# Patient Record
Sex: Female | Born: 1965 | Race: Black or African American | Hispanic: No | Marital: Married | Smoking: Never smoker
Health system: Southern US, Community
[De-identification: ages and names within clinical notes are randomized; demographics above are authoritative.]

## PROBLEM LIST (undated history)

## (undated) DIAGNOSIS — E669 Obesity, unspecified: Secondary | ICD-10-CM

## (undated) DIAGNOSIS — I10 Essential (primary) hypertension: Secondary | ICD-10-CM

---

## 2009-10-20 ENCOUNTER — Emergency Department (HOSPITAL_COMMUNITY): Admission: EM | Admit: 2009-10-20 | Discharge: 2009-10-20 | Payer: Self-pay | Admitting: Emergency Medicine

## 2011-03-22 LAB — POCT I-STAT, CHEM 8
Chloride: 104 mEq/L (ref 96–112)
Creatinine, Ser: 0.8 mg/dL (ref 0.4–1.2)
Glucose, Bld: 99 mg/dL (ref 70–99)
Potassium: 4.1 mEq/L (ref 3.5–5.1)

## 2016-06-19 ENCOUNTER — Emergency Department (HOSPITAL_COMMUNITY)
Admission: EM | Admit: 2016-06-19 | Discharge: 2016-06-19 | Disposition: A | Discharge status: Home / Self Care | Payer: Self-pay | Attending: Emergency Medicine | Admitting: Emergency Medicine

## 2016-06-19 ENCOUNTER — Encounter (HOSPITAL_COMMUNITY): Payer: Self-pay | Admitting: Family Medicine

## 2016-06-19 ENCOUNTER — Emergency Department (HOSPITAL_COMMUNITY): Payer: Self-pay

## 2016-06-19 DIAGNOSIS — R059 Cough, unspecified: Secondary | ICD-10-CM

## 2016-06-19 DIAGNOSIS — I10 Essential (primary) hypertension: Secondary | ICD-10-CM | POA: Insufficient documentation

## 2016-06-19 DIAGNOSIS — J029 Acute pharyngitis, unspecified: Secondary | ICD-10-CM | POA: Insufficient documentation

## 2016-06-19 DIAGNOSIS — R05 Cough: Secondary | ICD-10-CM

## 2016-06-19 HISTORY — DX: Essential (primary) hypertension: I10

## 2016-06-19 MED ORDER — HYDROCODONE-ACETAMINOPHEN 5-325 MG PO TABS
1.0000 | ORAL_TABLET | Freq: Once | ORAL | Status: AC
  Administered 2016-06-19: 1 via ORAL
  Filled 2016-06-19: qty 1

## 2016-06-19 MED ORDER — METOCLOPRAMIDE HCL 10 MG PO TABS
10.0000 mg | ORAL_TABLET | Freq: Once | ORAL | Status: AC
  Administered 2016-06-19: 10 mg via ORAL
  Filled 2016-06-19: qty 1

## 2016-06-19 MED ORDER — SODIUM CHLORIDE 0.9 % IV BOLUS (SEPSIS)
500.0000 mL | Freq: Once | INTRAVENOUS | Status: AC
  Administered 2016-06-19: 500 mL via INTRAVENOUS

## 2016-06-19 MED ORDER — ONDANSETRON HCL 4 MG/2ML IJ SOLN
4.0000 mg | Freq: Once | INTRAMUSCULAR | Status: AC
  Administered 2016-06-19: 4 mg via INTRAVENOUS
  Filled 2016-06-19: qty 2

## 2016-06-19 NOTE — ED Provider Notes (Signed)
CSN: [id number]     Arrival date & time 06/19/16  1657 History   First MD Initiated Contact with Patient 06/19/16 2038     Chief Complaint  Patient presents with  . Facial Swelling     (Consider location/radiation/quality/duration/timing/severity/associated sxs/prior Treatment) Patient is a 50 y.o. female presenting with cough. The history is provided by the patient.  Cough Cough characteristics:  Productive Sputum characteristics:  Yellow and green Severity:  Moderate Onset quality:  Gradual Duration:  1 week Timing:  Constant Progression:  Unchanged Chronicity:  New Smoker: no   Context: sick contacts (son has been sick)   Relieved by:  None tried Associated symptoms: headaches, rhinorrhea, sinus congestion, sore throat and wheezing   Associated symptoms: no chest pain, no chills, no fever, no myalgias, no rash and no shortness of breath     Past Medical History  Diagnosis Date  . Hypertension    History reviewed. No pertinent past surgical history. History reviewed. No pertinent family history. Social History  Substance Use Topics  . Smoking status: Never Smoker   . Smokeless tobacco: None  . Alcohol Use: No   OB History    No data available     Review of Systems  Constitutional: Negative for fever and chills.  HENT: Positive for congestion, rhinorrhea and sore throat.   Eyes: Positive for photophobia. Negative for visual disturbance.  Respiratory: Positive for cough and wheezing. Negative for shortness of breath.   Cardiovascular: Negative for chest pain.  Gastrointestinal: Positive for nausea. Negative for vomiting, diarrhea, constipation and abdominal distention.  Genitourinary: Negative for dysuria.  Musculoskeletal: Negative for myalgias.  Skin: Negative for rash and wound.  Neurological: Positive for headaches.  Psychiatric/Behavioral: Negative for confusion and agitation.      Allergies  Review of patient's allergies indicates no known  allergies.  Home Medications   Prior to Admission medications   Medication Sig Start Date End Date Taking? Authorizing Provider  ibuprofen (ADVIL,MOTRIN) 200 MG tablet Take 400 mg by mouth every 6 (six) hours as needed (pain).   Yes Historical Provider, MD  OVER THE COUNTER MEDICATION Take 1 tablet by mouth 2 (two) times daily as needed (pain/swelling). Multi-symptom allergy med   Yes Historical Provider, MD   BP 143/72 mmHg  Pulse 60  Temp(Src) 98.8 F (37.1 C) (Oral)  Resp 18  SpO2 100% Physical Exam  Constitutional: She is oriented to person, place, and time. She appears well-developed and well-nourished. No distress.  HENT:  Head: Normocephalic and atraumatic.  Tenderness to right temple. Tender left anterior cervical lymphadenopathy. Erythema of the posterior oropharynx with no swelling or pus.   Eyes: Conjunctivae are normal.  Cardiovascular: Normal rate and normal heart sounds.   No murmur heard. Pulmonary/Chest: Effort normal. No respiratory distress. She has wheezes. She has rales.  Abdominal: Soft. There is no tenderness.  Musculoskeletal: She exhibits no edema.  Neurological: She is alert and oriented to person, place, and time.  Skin: Skin is warm. She is not diaphoretic.  Psychiatric: She has a normal mood and affect. Her behavior is normal.  Nursing note and vitals reviewed.   ED Course  Procedures (including critical care time) Labs Review Labs Reviewed - No data to display  Imaging Review Dg Chest 2 View  06/19/2016  CLINICAL DATA:  Cough, congestion and headache, several days duration. Worsened dizziness. History of hypertension. EXAM: CHEST  2 VIEW COMPARISON:  None. FINDINGS: Heart size is normal. Mediastinal shadows are normal except for some unfolding  of the aorta. Pulmonary vascularity is normal. Lungs are clear. No effusions. No bony abnormalities. 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 No active disease. Electronically  Signed   By: Paulina FusiMark  Shogry M.D.   On: 06/19/2016 21:34   I have personally reviewed and evaluated these images and lab results as part of my medical decision-making.   EKG Interpretation None      MDM   Final diagnoses:  Cough  Sore throat    Patient presents for 2-3 days of cough, sore throat, and left cervical lymphadenopathy.  No fever.  Associated right sided headache. Has mild tenderness of temple. Unlikely temporal arteritis given well appearance, no fever, concomitant viral symptoms. Headache improved with reglan in ED. CXR shows no acute abnormalities.   Patient given strict return precuations and discharged home in good condition. Seen with Dr. Lynelle DoctorKnapp.   Levora AngelEric Julliette Frentz, MD 06/20/16 1333  Linwood DibblesJon Knapp, MD 06/21/16 [phone]

## 2016-06-19 NOTE — ED Notes (Signed)
Pt here for left sided facial swelling and swelling  in left jaw and neck since yesterday. sts headache.

## 2016-06-19 NOTE — Discharge Instructions (Signed)

## 2016-06-19 NOTE — ED Notes (Signed)
Patient Alert and oriented X4. Stable and ambulatory. Patient verbalized understanding of the discharge instructions.  Patient belongings were taken by the patient.  

## 2017-02-06 ENCOUNTER — Emergency Department (HOSPITAL_COMMUNITY)
Admission: EM | Admit: 2017-02-06 | Discharge: 2017-02-06 | Disposition: A | Discharge status: Home / Self Care | Payer: BLUE CROSS/BLUE SHIELD | Attending: Emergency Medicine | Admitting: Emergency Medicine

## 2017-02-06 ENCOUNTER — Encounter (HOSPITAL_COMMUNITY): Payer: Self-pay | Admitting: *Deleted

## 2017-02-06 ENCOUNTER — Emergency Department (HOSPITAL_COMMUNITY): Payer: BLUE CROSS/BLUE SHIELD

## 2017-02-06 DIAGNOSIS — J0111 Acute recurrent frontal sinusitis: Secondary | ICD-10-CM | POA: Insufficient documentation

## 2017-02-06 DIAGNOSIS — J3489 Other specified disorders of nose and nasal sinuses: Secondary | ICD-10-CM | POA: Diagnosis present

## 2017-02-06 DIAGNOSIS — I10 Essential (primary) hypertension: Secondary | ICD-10-CM | POA: Diagnosis not present

## 2017-02-06 DIAGNOSIS — R0789 Other chest pain: Secondary | ICD-10-CM

## 2017-02-06 DIAGNOSIS — J0110 Acute frontal sinusitis, unspecified: Secondary | ICD-10-CM

## 2017-02-06 HISTORY — DX: Obesity, unspecified: E66.9

## 2017-02-06 LAB — CBC
HCT: 41 % (ref 36.0–46.0)
Hemoglobin: 13.4 g/dL (ref 12.0–15.0)
MCH: 28.2 pg (ref 26.0–34.0)
MCHC: 32.7 g/dL (ref 30.0–36.0)
MCV: 86.1 fL (ref 78.0–100.0)
PLATELETS: 272 10*3/uL (ref 150–400)
RBC: 4.76 MIL/uL (ref 3.87–5.11)
RDW: 14.6 % (ref 11.5–15.5)
WBC: 7.4 10*3/uL (ref 4.0–10.5)

## 2017-02-06 LAB — I-STAT TROPONIN, ED: TROPONIN I, POC: 0 ng/mL (ref 0.00–0.08)

## 2017-02-06 LAB — BASIC METABOLIC PANEL
Anion gap: 8 (ref 5–15)
BUN: 8 mg/dL (ref 6–20)
CALCIUM: 9.7 mg/dL (ref 8.9–10.3)
CHLORIDE: 102 mmol/L (ref 101–111)
CO2: 27 mmol/L (ref 22–32)
CREATININE: 0.96 mg/dL (ref 0.44–1.00)
GFR calc non Af Amer: >60 mL/min (ref >60)
Glucose, Bld: 93 mg/dL (ref 65–99)
Potassium: 3.8 mmol/L (ref 3.5–5.1)
SODIUM: 137 mmol/L (ref 135–145)

## 2017-02-06 MED ORDER — BENZONATATE 100 MG PO CAPS: 100.0000 mg | ORAL_CAPSULE | Freq: Three times a day (TID) | ORAL | 0 refills | Status: DC

## 2017-02-06 MED ORDER — CHLORTHALIDONE 25 MG PO TABS: 25.0000 mg | ORAL_TABLET | Freq: Every day | ORAL | 0 refills | Status: DC

## 2017-02-06 NOTE — ED Provider Notes (Signed)
[facility]-EMERGENCY DEPT Provider Note   CSN: [id number] Arrival date & time: 02/06/17  1411     History   Chief Complaint Chief Complaint  Patient presents with  . Chest Pain  . Shortness of Breath    HPI Deanna Munoz is a 51 y.o. female.  HPI   51 year old female presents today with numerous complaints.  She reports 2 weeks of upper respiratory congestion, sinus pressure, and rhinorrhea.  She notes some minor shortness of breath with exertion secondary to upper respiratory congestion.  She notes persistent cough that has now caused anterior chest wall pain worse with coughing or palpation.  Patient notes she was seen in her primary care provider's office for the above symptoms, was noted to be hypertensive.  Patient has a history of the same, but has not been on any medication recently.  She notes that the primary care provider felt uncomfortable managing her blood pressure was in the 180 systolic region and wanted her to be evaluated in the emergency room.  Patient notes that she has a history of migraines.  She notes today's headache feels similar with associated paresthesias in her hands.  She denies any history of asthma or allergies, no shortness of breath or chest pain at rest.     Past Medical History:  Diagnosis Date  . Hypertension   . Obesity     There are no active problems to display for this patient.   History reviewed. No pertinent surgical history.  OB History    No data available      Home Medications    Prior to Admission medications   Medication Sig Start Date End Date Taking? Authorizing Provider  benzonatate (TESSALON) 100 MG capsule Take 1 capsule (100 mg total) by mouth every 8 (eight) hours. 02/06/17   Eyvonne MechanicJeffrey Dayna Alia, PA-C  chlorthalidone (HYGROTON) 25 MG tablet Take 1 tablet (25 mg total) by mouth daily. 02/06/17   Eyvonne MechanicJeffrey Leeon Makar, PA-C  ibuprofen (ADVIL,MOTRIN) 200 MG tablet Take 400 mg by mouth every 6 (six) hours as needed (pain).    Historical  Provider, MD  OVER THE COUNTER MEDICATION Take 1 tablet by mouth 2 (two) times daily as needed (pain/swelling). Multi-symptom allergy med    Historical Provider, MD    Family History History reviewed. No pertinent family history.  Social History Social History  Substance Use Topics  . Smoking status: Never Smoker  . Smokeless tobacco: Not on file  . Alcohol use No     Allergies   Patient has no known allergies.   Review of Systems Review of Systems  All other systems reviewed and are negative.    Physical Exam Updated Vital Signs BP 175/92   Pulse (!) 57   Temp 98.6 F (37 C) (Oral)   Resp 18   Ht 5' 4.5" (1.638 m)   Wt 113.5 kg   SpO2 98%   BMI 42.28 kg/m   Physical Exam  Constitutional: She is oriented to person, place, and time. She appears well-developed and well-nourished.  HENT:  Head: Normocephalic and atraumatic.  Nose: Rhinorrhea present.  Eyes: Conjunctivae are normal. Pupils are equal, round, and reactive to light. Right eye exhibits no discharge. Left eye exhibits no discharge. No scleral icterus.  Neck: Normal range of motion. No JVD present. No tracheal deviation present.  Cardiovascular: Normal rate and regular rhythm.   Pulmonary/Chest: Effort normal and breath sounds normal. No stridor. No respiratory distress. She has no wheezes. She has no rales. She exhibits no tenderness.  Anterior chest wall over the sternum exquisitely tender to palpation; this is patient's reported pain  Abdominal: Soft. There is no tenderness.  Musculoskeletal: She exhibits no edema.  Neurological: She is alert and oriented to person, place, and time. No cranial nerve deficit or sensory deficit. Coordination normal. GCS eye subscore is 4. GCS verbal subscore is 5. GCS motor subscore is 6.  Skin: Skin is warm.  Psychiatric: She has a normal mood and affect. Her behavior is normal. Judgment and thought content normal.  Nursing note and vitals reviewed.   ED Treatments /  Results  Labs (all labs ordered are listed, but only abnormal results are displayed) Labs Reviewed  BASIC METABOLIC PANEL  CBC  I-STAT TROPOININ, ED    EKG  EKG Interpretation None       Radiology Dg Chest 2 View  Result Date: 02/06/2017 CLINICAL DATA:  Cp. Pt has been having left side chest pain since this morning. She went to primary care and was told to come to the hospital. She said her blood pressure was reading 180 over 110 at primary care. She also stated that she's been feeling numbness down her entire left arm, with a headache for 2 days. She stated that she had a cold for 2 weeks before this. Hx of HTN EXAM: CHEST  2 VIEW COMPARISON:  06/19/2016. FINDINGS: The heart size and mediastinal contours are within normal limits. Both lungs are clear. No pleural effusion or pneumothorax. The visualized skeletal structures are unremarkable. IMPRESSION: No active cardiopulmonary disease. Electronically Signed   By: Amie Portland M.D.   On: 02/06/2017 14:53    Procedures Procedures (including critical care time)  Medications Ordered in ED Medications - No data to display   Initial Impression / Assessment and Plan / ED Course  I have reviewed the triage vital signs and the nursing notes.  Pertinent labs & imaging results that were available during my care of the patient were reviewed by me and considered in my medical decision making (see chart for details).      Final Clinical Impressions(s) / ED Diagnoses   Final diagnoses:  Acute non-recurrent frontal sinusitis  Chest wall pain  Hypertension, unspecified type    Labs: Stat troponin, BMP, CBC  Imaging: CT chest 2 view  Consults:  Therapeutics:  Discharge Meds: Chlorthalidone, Tessalon  Assessment/Plan:   Patient's presentation is most consistent with sinusitis.  Based on her continued complaints over the last several weeks she will be started on Augmentin for presumed bacterial sinusitis.  Patient's chest pain  is reproducible on exam, she has no signs of ACS, PE, this is likely secondary to recent cough and upper respiratory infection.  Patient hypertensive here, she has a history of the same not currently taking any medications.  She reports that her primary care wants to see her in the next 2 days after ED evaluation.  She will be started on chlorthalidone with close follow-up with primary care.  She has no signs of endorgan damage here today.  Patient is given strict return precautions, she verbalized understanding and agreement to today's plan had no further questions or concerns at the time of discharge.     New Prescriptions Discharge Medication List as of 02/06/2017  6:28 PM    START taking these medications   Details  chlorthalidone (HYGROTON) 25 MG tablet Take 1 tablet (25 mg total) by mouth daily., Starting Tue 02/06/2017, Print         Eyvonne Mechanic, PA-C 02/06/17 2020  Benjiman Core, MD 02/08/17 [phone]

## 2017-02-06 NOTE — ED Triage Notes (Signed)
Pt reports recent chest pains, left arm pain/numbness x 2 days. Also having headache, bilateral leg swelling, cough, sob with exertion. ekg done at triage.

## 2017-02-06 NOTE — Discharge Instructions (Signed)
Please read attached information. If you experience any new or worsening signs or symptoms please return to the emergency room for evaluation. Please follow-up with your primary care provider tomorrow for re-evaluation. Please use medication prescribed only as directed and discontinue taking if you have any concerning signs or symptoms.

## 2017-02-06 NOTE — ED Notes (Signed)
Pt comfortable with discharge and follow up instructions. Pt declines wheelchair, escorted to waiting area by this RN. Rx x2 

## 2017-02-28 ENCOUNTER — Other Ambulatory Visit: Payer: Self-pay | Admitting: Internal Medicine

## 2017-02-28 DIAGNOSIS — Z1231 Encounter for screening mammogram for malignant neoplasm of breast: Secondary | ICD-10-CM

## 2017-03-01 ENCOUNTER — Ambulatory Visit: Payer: 59

## 2017-03-02 ENCOUNTER — Ambulatory Visit
Admission: RE | Admit: 2017-03-02 | Discharge: 2017-03-02 | Disposition: A | Discharge status: Home / Self Care | Payer: BLUE CROSS/BLUE SHIELD | Source: Ambulatory Visit | Attending: Internal Medicine | Admitting: Internal Medicine

## 2017-03-02 DIAGNOSIS — Z1231 Encounter for screening mammogram for malignant neoplasm of breast: Secondary | ICD-10-CM

## 2018-02-08 ENCOUNTER — Other Ambulatory Visit: Payer: Self-pay | Admitting: Nurse Practitioner

## 2018-02-08 DIAGNOSIS — Z1231 Encounter for screening mammogram for malignant neoplasm of breast: Secondary | ICD-10-CM

## 2018-02-15 ENCOUNTER — Other Ambulatory Visit: Payer: Self-pay

## 2018-02-15 ENCOUNTER — Encounter: Payer: Self-pay | Admitting: Emergency Medicine

## 2018-02-15 ENCOUNTER — Emergency Department
Admission: EM | Admit: 2018-02-15 | Discharge: 2018-02-15 | Disposition: A | Discharge status: Home / Self Care | Payer: Worker's Compensation | Attending: Emergency Medicine | Admitting: Emergency Medicine

## 2018-02-15 DIAGNOSIS — I10 Essential (primary) hypertension: Secondary | ICD-10-CM | POA: Diagnosis not present

## 2018-02-15 DIAGNOSIS — M5442 Lumbago with sciatica, left side: Secondary | ICD-10-CM | POA: Diagnosis not present

## 2018-02-15 DIAGNOSIS — Z79899 Other long term (current) drug therapy: Secondary | ICD-10-CM | POA: Insufficient documentation

## 2018-02-15 DIAGNOSIS — M545 Low back pain: Secondary | ICD-10-CM | POA: Diagnosis present

## 2018-02-15 MED ORDER — LIDOCAINE 5 % EX PTCH
2.0000 | MEDICATED_PATCH | CUTANEOUS | Status: DC
  Administered 2018-02-15: 2 via TRANSDERMAL
  Filled 2018-02-15: qty 2

## 2018-02-15 MED ORDER — CYCLOBENZAPRINE HCL 10 MG PO TABS: 10.0000 mg | ORAL_TABLET | Freq: Three times a day (TID) | ORAL | 0 refills | Status: DC | PRN

## 2018-02-15 MED ORDER — KETOROLAC TROMETHAMINE 60 MG/2ML IM SOLN
60.0000 mg | Freq: Once | INTRAMUSCULAR | Status: AC
  Administered 2018-02-15: 60 mg via INTRAMUSCULAR
  Filled 2018-02-15: qty 2

## 2018-02-15 NOTE — ED Triage Notes (Addendum)
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

## 2018-02-15 NOTE — ED Notes (Signed)
workmans comp UDS completed.  Sample walked to lab.

## 2018-02-15 NOTE — ED Provider Notes (Signed)
[facility] Emergency Department Provider Note    First MD Initiated Contact with Patient 02/15/18 [phone]     (approximate)  I have reviewed the triage vital signs and the nursing notes.   HISTORY  Chief Complaint Back Pain    HPI Deanna Munoz is a 52 y.o. female presents to the emergency department with left-sided low back pain with radiation down.  Patient states current pain score is 8 out of 10 and worse with any movement or walking.  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  Patient denies any bowel or bladder incontinence.  Patient denies any fever   Past Medical History:  Diagnosis Date  . Hypertension   . Obesity     There are no active problems to display for this patient.   History reviewed. No pertinent surgical history.  Prior to Admission medications   Medication Sig Start Date End Date Taking? Authorizing Provider  benzonatate (TESSALON) 100 MG capsule Take 1 capsule (100 mg total) by mouth every 8 (eight) hours. 02/06/17   Hedges, Tinnie Gens, PA-C  chlorthalidone (HYGROTON) 25 MG tablet Take 1 tablet (25 mg total) by mouth daily. 02/06/17   Hedges, Tinnie Gens, PA-C  ibuprofen (ADVIL,MOTRIN) 200 MG tablet Take 400 mg by mouth every 6 (six) hours as needed (pain).    [provider]  OVER THE COUNTER MEDICATION Take 1 tablet by mouth 2 (two) times daily as needed (pain/swelling). Multi-symptom allergy med    [provider]    Allergies No known drug allergies No family history on file.  Social History Social History   Tobacco Use  . Smoking status: Never Smoker  . Smokeless tobacco: Never Used  Substance Use Topics  . Alcohol use: No  . Drug use: No    Review of Systems Constitutional: No fever/chills Eyes: No visual changes. ENT: No sore throat. Cardiovascular: Denies chest  pain. Respiratory: Denies shortness of breath. Gastrointestinal: No abdominal pain.  No nausea, no vomiting.  No diarrhea.  No constipation. Genitourinary: Negative for dysuria. Musculoskeletal: Negative for neck pain.  Positive for back pain. Integumentary: Negative for rash. Neurological: Negative for headaches, focal weakness or numbness.   ____________________________________________   PHYSICAL EXAM:  VITAL SIGNS: ED Triage Vitals [02/15/18 0023]  Enc Vitals Group     BP 123/83     Pulse Rate 70     Resp 18     Temp 98.3 F (36.8 C)     Temp Source Oral     SpO2 100 %     Weight 117.9 kg (260 lb)     Height 1.651 m (5\' 5" )     Head Circumference      Peak Flow      Pain Score 7     Pain Loc      Pain Edu?      Excl. in GC?     Constitutional: Alert and oriented. Well appearing and in no acute distress. Eyes: Conjunctivae are normal.  Head: Atraumatic. Mouth/Throat: Mucous membranes are moist.  Oropharynx non-erythematous. Neck: No stridor.   Cardiovascular: Normal rate, regular rhythm. Good peripheral circulation. Grossly normal heart sounds. Respiratory: Normal respiratory effort.  No retractions. Lungs CTAB. Gastrointestinal: Soft and nontender. No distention.  Musculoskeletal: Pain to palpation left lumbar paraspinal muscles Neurologic:  Normal speech and language. No gross focal neurologic deficits are appreciated.  Skin:  Skin is warm, dry and intact. No rash noted. Psychiatric: Mood and affect are normal. Speech and behavior are normal.    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  Concern for  possible sciatica given radiation down the posterior left versus lumbar spinal muscle strain.  Patient given IM Toradol and Lidoderm patch applied to the area.  Spoke with the patient at length regarding the necessity of following up with Dr. Marcell BarlowYarborough given possibility of sciatica ____________________________________________  FINAL CLINICAL IMPRESSION(S) / ED DIAGNOSES  Final diagnoses:  Acute left-sided low back pain with left-sided sciatica     MEDICATIONS GIVEN DURING THIS VISIT:  Medications  ketorolac (TORADOL) injection 60 mg (not administered)  lidocaine (LIDODERM) 5 % 2 patch (not administered)     ED Discharge Orders    None       Note:  This document was prepared using Dragon voice recognition software and may include unintentional dictation errors.    Darci CurrentBrown, [place] N, MD 02/15/18 [phone]

## 2018-02-15 NOTE — ED Notes (Signed)
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

## 2018-02-15 NOTE — ED Notes (Signed)
ED Provider at bedside. 

## 2018-03-04 ENCOUNTER — Ambulatory Visit: Payer: BLUE CROSS/BLUE SHIELD

## 2018-03-15 ENCOUNTER — Ambulatory Visit
Admission: RE | Admit: 2018-03-15 | Discharge: 2018-03-15 | Disposition: A | Discharge status: Home / Self Care | Payer: Commercial Managed Care - PPO | Source: Ambulatory Visit | Attending: Nurse Practitioner | Admitting: Nurse Practitioner

## 2018-03-15 DIAGNOSIS — Z1231 Encounter for screening mammogram for malignant neoplasm of breast: Secondary | ICD-10-CM

## 2020-09-15 NOTE — Patient Instructions (Signed)
Thank you for choosing Primary Care at [facility] to be your medical home!    Tekeisha Gisler was seen by De Hollingshead, DO today.   Malvika Hennick's primary care provider is Marcy Siren, DO.   For the best care possible, you should try to see Marcy Siren, DO whenever you come to the clinic.   We look forward to seeing you again soon!  If you have any questions about your visit today, please call us at [phone] or feel free to reach your primary care provider via MyChart.

## 2020-09-16 ENCOUNTER — Encounter: Payer: Self-pay | Admitting: Internal Medicine

## 2020-09-16 ENCOUNTER — Telehealth (INDEPENDENT_AMBULATORY_CARE_PROVIDER_SITE_OTHER): Payer: 59 | Admitting: Internal Medicine

## 2020-09-16 DIAGNOSIS — Z7689 Persons encountering health services in other specified circumstances: Secondary | ICD-10-CM

## 2020-09-16 DIAGNOSIS — I10 Essential (primary) hypertension: Secondary | ICD-10-CM | POA: Diagnosis not present

## 2020-09-16 DIAGNOSIS — Z1211 Encounter for screening for malignant neoplasm of colon: Secondary | ICD-10-CM

## 2020-09-16 DIAGNOSIS — Z1231 Encounter for screening mammogram for malignant neoplasm of breast: Secondary | ICD-10-CM

## 2020-09-16 NOTE — Progress Notes (Signed)
Virtual Visit via Telephone Note  I connected with Deanna Munoz, on 09/16/2020 at 3:44 PM by telephone due to the COVID-19 pandemic and verified that I am speaking with the correct person using two identifiers.   Consent: I discussed the limitations, risks, security and privacy concerns of performing an evaluation and management service by telephone and the availability of in person appointments. I also discussed with the patient that there may be a patient responsible charge related to this service. The patient expressed understanding and agreed to proceed.   Location of Patient: Home   Location of Provider: Clinic    Persons participating in Telemedicine visit: Sanai Pearson [facility] Dr. Earlene Plater      History of Present Illness: Patient has a visit to establish care. Used to have care at [facility]. Was due to have her annual check up in March 2021 but this did not happen. PMH of seasonal allergies with mild asthma on Singulair and HTN on Chlorthalidone 50 mg. Last time she checked her BP at home was about 2 weeks ago. Last BP was 140-150/80s.   She has had a hysterectomy--she is unsure if she had her cervix removed.    Past Medical History:  Diagnosis Date  . Hypertension   . Obesity    No Known Allergies  Current Outpatient Medications on File Prior to Visit  Medication Sig Dispense Refill  . chlorthalidone (HYGROTON) 50 MG tablet Take 50 mg by mouth daily.    . montelukast (SINGULAIR) 10 MG tablet Take 10 mg by mouth daily as needed (allergies).     No current facility-administered medications on file prior to visit.    Observations/Objective: NAD. Speaking clearly.  Work of breathing normal.  Alert and oriented. Mood appropriate.   Assessment and Plan: 1. Encounter to establish care Reviewed patient's PMH, social history, surgical history, and medications.  Is overdue for annual exam, screening blood work, and health maintenance topics.  Have asked patient to return for visit to address these items.  Will need to determine if she has cervix and if she needs PAP.   2. Essential hypertension Unclear what BP trend is. Will need to monitor in person at future visit. Asymptomatic.   3. Breast cancer screening by mammogram - MM Digital Screening; Future  4. Colon cancer screening - Ambulatory referral to Gastroenterology   Follow Up Instructions: Annual exam    I discussed the assessment and treatment plan with the patient. The patient was provided an opportunity to ask questions and all were answered. The patient agreed with the plan and demonstrated an understanding of the instructions.   The patient was advised to call back or seek an in-person evaluation if the symptoms worsen or if the condition fails to improve as anticipated.     I provided 20 minutes total of non-face-to-face time during this encounter including median intraservice time, reviewing previous notes, investigations, ordering medications, medical decision making, coordinating care and patient verbalized understanding at the end of the visit.    Marcy Siren, D.O. Primary Care at [facility]  09/16/2020, 3:44 PM

## 2020-11-04 ENCOUNTER — Ambulatory Visit: Payer: 59

## 2020-11-24 NOTE — Patient Instructions (Signed)
Thank you for choosing Primary Care at [facility] to be your medical home!    Deanna Munoz was seen by De Hollingshead, DO today.   Camri Tinch's primary care provider is Marcy Siren, DO.   For the best care possible, you should try to see Marcy Siren, DO whenever you come to the clinic.   We look forward to seeing you again soon!  If you have any questions about your visit today, please call us at [phone] or feel free to reach your primary care provider via MyChart.   Keeping You Healthy  Get These Tests  Blood Pressure- Have your blood pressure checked by your healthcare provider at least once a year.  Normal blood pressure is 120/80.  Weight- Have your body mass index (BMI) calculated to screen for obesity.  BMI is a measure of body fat based on height and weight.  You can calculate your own BMI at https://www.west-esparza.com/  Cholesterol- Have your cholesterol checked every year.  Diabetes- Have your blood sugar checked every year if you have high blood pressure, high cholesterol, a family history of diabetes or if you are overweight.  Pap Test - Have a pap test every 1 to 5 years if you have been sexually active.  If you are older than 65 and recent pap tests have been normal you may not need additional pap tests.  In addition, if you have had a hysterectomy  for benign disease additional pap tests are not necessary.  Mammogram-Yearly mammograms are essential for early detection of breast cancer  Screening for Colon Cancer- Colonoscopy starting at age 77. Screening may begin sooner depending on your family history and other health conditions.  Follow up colonoscopy as directed by your Gastroenterologist.  Screening for Osteoporosis- Screening begins at age 39 with bone density scanning, sooner if you are at higher risk for developing Osteoporosis.  Get these medicines  Calcium with Vitamin D- Your body requires 1200-1500 mg of Calcium a day and  [phone] IU of Vitamin D a day.  You can only absorb 500 mg of Calcium at a time therefore Calcium must be taken in 2 or 3 separate doses throughout the day.  Hormones- Hormone therapy has been associated with increased risk for certain cancers and heart disease.  Talk to your healthcare provider about if you need relief from menopausal symptoms.  Aspirin- Ask your healthcare provider about taking Aspirin to prevent Heart Disease and Stroke.  Get these Immuniztions  Flu shot- Every fall  Pneumonia shot- Once after the age of 41; if you are younger ask your healthcare provider if you need a pneumonia shot.  Tetanus- Every ten years.  Zostavax- Once after the age of 72 to prevent shingles.  Take these steps  Don't smoke- Your healthcare provider can help you quit. For tips on how to quit, ask your healthcare provider or go to www.smokefree.gov or call 1-800 QUIT-NOW.  Be physically active- Exercise 5 days a week for a minimum of 30 minutes.  If you are not already physically active, start slow and gradually work up to 30 minutes of moderate physical activity.  Try walking, dancing, bike riding, swimming, etc.  Eat a healthy diet- Eat a variety of healthy foods such as fruits, vegetables, whole grains, low fat milk, low fat cheeses, yogurt, lean meats, chicken, fish, eggs, dried beans, tofu, etc.  For more information go to www.thenutritionsource.org  Dental visit- Brush and floss teeth twice daily; visit your dentist twice a year.  Eye exam- Visit your Optometrist or Ophthalmologist yearly.  Drink alcohol in moderation- Limit alcohol intake to one drink or less a day.  Never drink and drive.  Depression- Your emotional health is as important as your physical health.  If you're feeling down or losing interest in things you normally enjoy, please talk to your healthcare provider.  Seat Belts- can save your life; always wear one  Smoke/Carbon Monoxide detectors- These detectors need to  be installed on the appropriate level of your home.  Replace batteries at least once a year.  Violence- If anyone is threatening or hurting you, please tell your healthcare provider.  Living Will/ Health care power of attorney- Discuss with your healthcare provider and family.

## 2020-11-25 ENCOUNTER — Other Ambulatory Visit: Payer: Self-pay

## 2020-11-25 ENCOUNTER — Encounter: Payer: Self-pay | Admitting: Internal Medicine

## 2020-11-25 ENCOUNTER — Ambulatory Visit (INDEPENDENT_AMBULATORY_CARE_PROVIDER_SITE_OTHER): Payer: 59 | Admitting: Internal Medicine

## 2020-11-25 VITALS — BP 141/86 | HR 63 | Temp 97.3°F | Resp 17 | Ht 65.0 in

## 2020-11-25 DIAGNOSIS — Z114 Encounter for screening for human immunodeficiency virus [HIV]: Secondary | ICD-10-CM

## 2020-11-25 DIAGNOSIS — I10 Essential (primary) hypertension: Secondary | ICD-10-CM

## 2020-11-25 DIAGNOSIS — Z13220 Encounter for screening for lipoid disorders: Secondary | ICD-10-CM

## 2020-11-25 DIAGNOSIS — R32 Unspecified urinary incontinence: Secondary | ICD-10-CM

## 2020-11-25 DIAGNOSIS — Z0000 Encounter for general adult medical examination without abnormal findings: Secondary | ICD-10-CM | POA: Diagnosis not present

## 2020-11-25 DIAGNOSIS — Z13228 Encounter for screening for other metabolic disorders: Secondary | ICD-10-CM

## 2020-11-25 DIAGNOSIS — E66813 Obesity, class 3: Secondary | ICD-10-CM

## 2020-11-25 DIAGNOSIS — Z130 Encounter for screening for diseases of the blood and blood-forming organs and certain disorders involving the immune mechanism: Secondary | ICD-10-CM | POA: Diagnosis not present

## 2020-11-25 DIAGNOSIS — J4520 Mild intermittent asthma, uncomplicated: Secondary | ICD-10-CM

## 2020-11-25 DIAGNOSIS — Z1159 Encounter for screening for other viral diseases: Secondary | ICD-10-CM

## 2020-11-25 DIAGNOSIS — Z113 Encounter for screening for infections with a predominantly sexual mode of transmission: Secondary | ICD-10-CM

## 2020-11-25 MED ORDER — MONTELUKAST SODIUM 10 MG PO TABS
10.0000 mg | ORAL_TABLET | Freq: Every day | ORAL | 1 refills | Status: DC | PRN
Start: 2020-11-25 — End: 2021-05-06

## 2020-11-25 MED ORDER — ALBUTEROL SULFATE HFA 108 (90 BASE) MCG/ACT IN AERS: 2.0000 | INHALATION_SPRAY | Freq: Four times a day (QID) | RESPIRATORY_TRACT | 2 refills | Status: DC | PRN

## 2020-11-25 MED ORDER — CHLORTHALIDONE 50 MG PO TABS: 50.0000 mg | ORAL_TABLET | Freq: Every day | ORAL | 1 refills | Status: DC

## 2020-11-25 MED ORDER — PHENTERMINE-TOPIRAMATE ER 3.75-23 MG PO CP24: ORAL_CAPSULE | ORAL | 1 refills | Status: DC

## 2020-11-25 NOTE — Progress Notes (Signed)
Subjective:    Deanna Munoz - 54 y.o. female MRN [id number]  Date of birth: 10-13-1966  HPI  Deanna Munoz is here for annual exam. Mammogram is scheduled for 12/14. Not a candidate for PAP due to history of hysterectomy. Has received flu vaccine. Has received 3 part COVID vaccination series.    Health Maintenance:  Health Maintenance Due  Topic Date Due  . TETANUS/TDAP  Never done  . COLONOSCOPY  Never done    -  reports that she has never smoked. She has never used smokeless tobacco. - Review of Systems: Per HPI. - Past Medical History: Patient Active Problem List   Diagnosis Date Noted  . Essential hypertension 09/16/2020   - Medications: reviewed and updated   Objective:   Physical Exam BP (!) 141/86   Pulse 63   Temp (!) 97.3 F (36.3 C) (Temporal)   Resp 17   Ht 5\' 5"  (1.651 m)   SpO2 96%   BMI 43.27 kg/m  Physical Exam Constitutional:      Appearance: She is not diaphoretic.  HENT:     Head: Normocephalic and atraumatic.     Mouth/Throat:     Mouth: Oropharynx is clear and moist.      Comments: TMs normal bilaterally Eyes:     Extraocular Movements: EOM normal.     Conjunctiva/sclera: Conjunctivae normal.     Pupils: Pupils are equal, round, and reactive to light.  Neck:     Thyroid: No thyromegaly.  Cardiovascular:     Rate and Rhythm: Normal rate and regular rhythm.     Pulses: Intact distal pulses.     Heart sounds: Normal heart sounds. No murmur heard.   Pulmonary:     Effort: Pulmonary effort is normal. No respiratory distress.     Breath sounds: Normal breath sounds. No wheezing.  Abdominal:     General: Bowel sounds are normal. There is no distension.     Palpations: Abdomen is soft.     Tenderness: There is no abdominal tenderness. There is no guarding or rebound.  Musculoskeletal:        General: No deformity or edema. Normal range of motion.     Cervical back: Normal range of motion and neck supple.  Lymphadenopathy:      Cervical: No cervical adenopathy.  Skin:    General: Skin is warm and dry.     Findings: No rash.  Neurological:     Mental Status: She is alert and oriented to person, place, and time.     Gait: Gait is intact.  Psychiatric:        Mood and Affect: Mood and affect normal.        Judgment: Judgment normal.            Assessment & Plan:   1. Annual physical exam Counseled on 150 minutes of exercise per week, healthy eating (including decreased daily intake of saturated fats, cholesterol, added sugars, sodium), STI prevention, routine healthcare maintenance. - CBC with Differential - Comprehensive metabolic panel - Lipid Panel  2. Screening for deficiency anemia - CBC with Differential  3. Screening for metabolic disorder - Comprehensive metabolic panel  4. Lipid screening - Lipid Panel  5. Need for hepatitis C screening test - HCV Ab w/Rflx to Verification  6. Screening for HIV (human immunodeficiency virus) - HIV antibody (with reflex)  7. Essential hypertension BP elevated today. Patient has been out of Chlorthalidone for a few days. Restart.  - chlorthalidone (HYGROTON) 50  MG tablet; Take 1 tablet (50 mg total) by mouth daily.  Dispense: 90 tablet; Refill: 1  8. Mild intermittent asthma without complication - albuterol (VENTOLIN HFA) 108 (90 Base) MCG/ACT inhaler; Inhale 2 puffs into the lungs every 6 (six) hours as needed for wheezing or shortness of breath.  Dispense: 8 g; Refill: 2  9. Class 3 severe obesity with serious comorbidity in adult, unspecified BMI, unspecified obesity type (HCC) Requests Rx for weight loss medication. Patient has had a hysterectomy so no concern for teratogenic medication. Will need to monitor BP with medication given history of HTN. Return in 4 weeks.  - Phentermine-Topiramate 3.75-23 MG CP24; Take one tablet daily for two weeks. Then increase to two tablets daily.  Dispense: 90 capsule; Refill: 1    Marcy Siren,  D.O. 12/03/2020, 6:14 PM Primary Care at [facility]

## 2020-11-26 LAB — CBC WITH DIFFERENTIAL/PLATELET
Basophils Absolute: 0 10*3/uL (ref 0.0–0.2)
Basos: 0 %
EOS (ABSOLUTE): 0.3 10*3/uL (ref 0.0–0.4)
Eos: 4 %
Hematocrit: 37.7 % (ref 34.0–46.6)
Hemoglobin: 12.6 g/dL (ref 11.1–15.9)
Immature Grans (Abs): 0 10*3/uL (ref 0.0–0.1)
Immature Granulocytes: 0 %
Lymphocytes Absolute: 2.4 10*3/uL (ref 0.7–3.1)
Lymphs: 32 %
MCH: 28.8 pg (ref 26.6–33.0)
MCHC: 33.4 g/dL (ref 31.5–35.7)
MCV: 86 fL (ref 79–97)
Monocytes Absolute: 0.5 10*3/uL (ref 0.1–0.9)
Monocytes: 6 %
Neutrophils Absolute: 4.3 10*3/uL (ref 1.4–7.0)
Neutrophils: 58 %
Platelets: 247 10*3/uL (ref 150–450)
RBC: 4.38 x10E6/uL (ref 3.77–5.28)
RDW: 12.8 % (ref 11.7–15.4)
WBC: 7.5 10*3/uL (ref 3.4–10.8)

## 2020-11-26 LAB — COMPREHENSIVE METABOLIC PANEL
ALT: 13 IU/L (ref 0–32)
AST: 16 IU/L (ref 0–40)
Albumin/Globulin Ratio: 1.3 (ref 1.2–2.2)
Albumin: 4 g/dL (ref 3.8–4.9)
Alkaline Phosphatase: 80 IU/L (ref 44–121)
BUN/Creatinine Ratio: 13 (ref 9–23)
BUN: 10 mg/dL (ref 6–24)
Bilirubin Total: 0.4 mg/dL (ref 0.0–1.2)
CO2: 27 mmol/L (ref 20–29)
Calcium: 9.6 mg/dL (ref 8.7–10.2)
Chloride: 97 mmol/L (ref 96–106)
Creatinine, Ser: 0.79 mg/dL (ref 0.57–1.00)
GFR calc Af Amer: 99 mL/min/{1.73_m2} (ref >59)
GFR calc non Af Amer: 86 mL/min/{1.73_m2} (ref >59)
Globulin, Total: 3.2 g/dL (ref 1.5–4.5)
Glucose: 91 mg/dL (ref 65–99)
Potassium: 3.8 mmol/L (ref 3.5–5.2)
Sodium: 138 mmol/L (ref 134–144)
Total Protein: 7.2 g/dL (ref 6.0–8.5)

## 2020-11-26 LAB — HCV INTERPRETATION

## 2020-11-26 LAB — HIV ANTIBODY (ROUTINE TESTING W REFLEX): HIV Screen 4th Generation wRfx: NONREACTIVE

## 2020-11-26 LAB — HCV AB W/RFLX TO VERIFICATION: HCV Ab: <0.1 s/co ratio (ref 0.0–0.9)

## 2020-11-26 LAB — LIPID PANEL
Chol/HDL Ratio: 3 ratio (ref 0.0–4.4)
Cholesterol, Total: 154 mg/dL (ref 100–199)
HDL: 52 mg/dL (ref >39)
LDL Chol Calc (NIH): 85 mg/dL (ref 0–99)
Triglycerides: 93 mg/dL (ref 0–149)
VLDL Cholesterol Cal: 17 mg/dL (ref 5–40)

## 2020-11-30 ENCOUNTER — Ambulatory Visit
Admission: RE | Admit: 2020-11-30 | Discharge: 2020-11-30 | Disposition: A | Discharge status: Home / Self Care | Payer: 59 | Source: Ambulatory Visit | Attending: Internal Medicine | Admitting: Internal Medicine

## 2020-11-30 DIAGNOSIS — Z1231 Encounter for screening mammogram for malignant neoplasm of breast: Secondary | ICD-10-CM

## 2021-02-11 ENCOUNTER — Telehealth: Payer: Self-pay | Admitting: Internal Medicine

## 2021-02-11 NOTE — Telephone Encounter (Signed)
Brandy from [facility] called saying that the Phentermine-Topiramate 3.75-23 MG CP24 was sent to [facility] pharmacy but the medication is $600 even with patient insurance. The pharmacist would like to know if it is ok for the provider to split the medication 37.5 for the Phentermine and 25mg  for the Topiramate. If the provider agrees please send Rx to [facility]   [address].

## 2021-02-22 ENCOUNTER — Other Ambulatory Visit: Payer: Self-pay | Admitting: Internal Medicine

## 2021-02-22 MED ORDER — TOPIRAMATE 25 MG PO TABS: 25.0000 mg | ORAL_TABLET | Freq: Every day | ORAL | 0 refills | Status: DC

## 2021-02-22 MED ORDER — PHENTERMINE HCL 37.5 MG PO CAPS: 37.5000 mg | ORAL_CAPSULE | ORAL | 0 refills | Status: DC

## 2021-02-22 NOTE — Telephone Encounter (Signed)
Pls contact pt and schedule follow-up appt w/ Earlene Plater in 4 wks for medication and weight monitoring

## 2021-02-22 NOTE — Telephone Encounter (Signed)
This was not addressed while I was out. I have sent in the two separate medications. Patient needs f/u for her weight and monitoring with the medications scheduled in 4 weeks.   Marcy Siren, D.O. Primary Care at [facility]  02/22/2021, 11:50 AM

## 2021-02-24 ENCOUNTER — Other Ambulatory Visit: Payer: Self-pay

## 2021-02-24 ENCOUNTER — Ambulatory Visit (INDEPENDENT_AMBULATORY_CARE_PROVIDER_SITE_OTHER): Payer: 59 | Admitting: Internal Medicine

## 2021-02-24 VITALS — BP 126/84 | HR 71 | Temp 96.1°F | Resp 16 | Wt 258.0 lb

## 2021-02-24 DIAGNOSIS — I10 Essential (primary) hypertension: Secondary | ICD-10-CM

## 2021-02-24 NOTE — Progress Notes (Signed)
  Subjective:    Deanna Munoz - 55 y.o. female MRN [id number]  Date of birth: 08/13/1966  HPI  Deanna Munoz is here for f/u.   Chronic HTN Disease Monitoring:  Home BP Monitoring - Occasional. Has been in range.  Chest pain- no  Dyspnea- no Headache - no  Medications: Chlorthalidone 50 mg  Compliance- yes (although missed dose this AM) Lightheadedness- no  Edema- no      Health Maintenance:  Health Maintenance Due  Topic Date Due  . TETANUS/TDAP  Never done  . COLONOSCOPY (Pts 45-28yrs Insurance coverage will need to be confirmed)  Never done    -  reports that she has never smoked. She has never used smokeless tobacco. - Review of Systems: Per HPI. - Past Medical History: Patient Active Problem List   Diagnosis Date Noted  . Essential hypertension 09/16/2020   - Medications: reviewed and updated   Objective:   Physical Exam BP 126/84   Pulse 71   Temp (!) 96.1 F (35.6 C)   Resp 16   Wt 258 lb (117 kg)   SpO2 97%   BMI 42.93 kg/m  Physical Exam Constitutional:      General: She is not in acute distress.    Appearance: She is not diaphoretic.  Cardiovascular:     Rate and Rhythm: Normal rate.  Pulmonary:     Effort: Pulmonary effort is normal. No respiratory distress.  Musculoskeletal:        General: Normal range of motion.  Skin:    General: Skin is warm and dry.  Neurological:     Mental Status: She is alert and oriented to person, place, and time.  Psychiatric:        Mood and Affect: Affect normal.        Judgment: Judgment normal.            Assessment & Plan:   1. Essential hypertension BP well controlled. Asymptomatic. Continue current regimen.   2. Class 3 severe obesity with serious comorbidity in adult, unspecified BMI, unspecified obesity type [facility]) Patient will be starting Phentermine-Topamax this week after picking up from pharmacy. Spent a good amount of time discussing that medication is an adjunct to  weight loss and will need to work on diet and exercise changes. Will monitor BP and HR with these medications. Discussed healthy eating habits and healthy snack options. Discussed planning ahead to avoid significant hunger and subsequent overeating. Encouraged patient to begin walking regimen. Discussed accountability partner for walking and step competitions with friends on Apple watch. Return in 6-8 weeks.    Marcy Siren, D.O. 02/24/2021, 9:34 AM Primary Care at [facility]

## 2021-04-22 ENCOUNTER — Ambulatory Visit: Payer: 59 | Admitting: Internal Medicine

## 2021-05-06 ENCOUNTER — Encounter: Payer: Self-pay | Admitting: Internal Medicine

## 2021-05-06 ENCOUNTER — Other Ambulatory Visit: Payer: Self-pay

## 2021-05-06 ENCOUNTER — Ambulatory Visit (INDEPENDENT_AMBULATORY_CARE_PROVIDER_SITE_OTHER): Payer: 59 | Admitting: Internal Medicine

## 2021-05-06 VITALS — BP 121/83 | HR 83 | Temp 97.7°F | Resp 15 | Ht 65.0 in | Wt 237.0 lb

## 2021-05-06 DIAGNOSIS — J301 Allergic rhinitis due to pollen: Secondary | ICD-10-CM

## 2021-05-06 DIAGNOSIS — I10 Essential (primary) hypertension: Secondary | ICD-10-CM

## 2021-05-06 MED ORDER — TOPIRAMATE 25 MG PO TABS: 25.0000 mg | ORAL_TABLET | Freq: Every day | ORAL | 1 refills | Status: DC

## 2021-05-06 MED ORDER — MONTELUKAST SODIUM 10 MG PO TABS
10.0000 mg | ORAL_TABLET | Freq: Every day | ORAL | 1 refills | Status: DC
Start: 2021-05-06 — End: 2022-03-16

## 2021-05-06 MED ORDER — PHENTERMINE HCL 37.5 MG PO CAPS: 37.5000 mg | ORAL_CAPSULE | ORAL | 1 refills | Status: DC

## 2021-05-06 NOTE — Progress Notes (Addendum)
  Subjective:    Deanna Munoz - 55 y.o. female MRN [id number]  Date of birth: Nov 25, 1966  HPI  Deanna Munoz is here for follow up.  Chronic HTN Disease Monitoring:  Home BP Monitoring - Monitors occasionally, no concerns with readings  Chest pain- no  Dyspnea- no Severe Headache - no  Medications: Chlorthalidone 50 mg  Compliance- yes Lightheadedness- no  Edema- no   Health Maintenance:  Health Maintenance Due  Topic Date Due  . TETANUS/TDAP  Never done  . COLONOSCOPY (Pts 45-74yrs Insurance coverage will need to be confirmed)  Never done    -  reports that she has never smoked. She has never used smokeless tobacco. - Review of Systems: Per HPI. - Past Medical History: Patient Active Problem List   Diagnosis Date Noted  . Class 3 severe obesity with serious comorbidity in adult [facility]) 05/06/2021  . Essential hypertension 09/16/2020   - Medications: reviewed and updated   Objective:   Physical Exam BP 121/83 (BP Location: Right Arm, Patient Position: Sitting, Cuff Size: Normal)   Pulse 83   Temp 97.7 F (36.5 C)   Resp 15   Ht 5\' 5"  (1.651 m)   Wt 237 lb (107.5 kg)   SpO2 96%   BMI 39.44 kg/m  Physical Exam Constitutional:      General: She is not in acute distress.    Appearance: She is not diaphoretic.  Cardiovascular:     Rate and Rhythm: Normal rate.  Pulmonary:     Effort: Pulmonary effort is normal. No respiratory distress.  Musculoskeletal:        General: Normal range of motion.  Skin:    General: Skin is warm and dry.  Neurological:     Mental Status: She is alert and oriented to person, place, and time.  Psychiatric:        Mood and Affect: Affect normal.        Judgment: Judgment normal.            Assessment & Plan:   1. Essential hypertension BP well controlled. Asymptomatic. Continue current regimen.   2. Class 3 severe obesity with serious comorbidity in adult, unspecified BMI, unspecified obesity type  (HCC) Patient has lost 21 lbs in 10 weeks, healthy rate of weight loss. BP and HR stable with weight loss medications, continue for now. Encouraged healthy food choices. Barrier to exercise right now is allergic rhinitis related to pollen. Monitor weight and vitals in 8 weeks.  - phentermine 37.5 MG capsule; Take 1 capsule (37.5 mg total) by mouth every morning.  Dispense: 30 capsule; Refill: 1 - topiramate (TOPAMAX) 25 MG tablet; Take 1 tablet (25 mg total) by mouth daily.  Dispense: 30 tablet; Refill: 1  3. Seasonal allergic rhinitis due to pollen - montelukast (SINGULAIR) 10 MG tablet; Take 1 tablet (10 mg total) by mouth at bedtime.  Dispense: 90 tablet; Refill: 1  , D.O. 05/06/2021, 9:43 AM Primary Care at [facility]

## 2021-05-06 NOTE — Progress Notes (Signed)
HTN

## 2021-05-20 ENCOUNTER — Other Ambulatory Visit: Payer: Self-pay

## 2021-05-20 DIAGNOSIS — I10 Essential (primary) hypertension: Secondary | ICD-10-CM

## 2021-05-20 MED ORDER — CHLORTHALIDONE 50 MG PO TABS: 50.0000 mg | ORAL_TABLET | Freq: Every day | ORAL | 1 refills | Status: DC

## 2021-07-11 ENCOUNTER — Ambulatory Visit: Payer: 59 | Admitting: Family

## 2021-07-22 ENCOUNTER — Encounter: Payer: Self-pay | Admitting: Family

## 2021-07-22 ENCOUNTER — Telehealth (INDEPENDENT_AMBULATORY_CARE_PROVIDER_SITE_OTHER): Payer: 59 | Admitting: Family

## 2021-07-22 ENCOUNTER — Other Ambulatory Visit: Payer: Self-pay

## 2021-07-22 DIAGNOSIS — F4321 Adjustment disorder with depressed mood: Secondary | ICD-10-CM

## 2021-07-22 DIAGNOSIS — I10 Essential (primary) hypertension: Secondary | ICD-10-CM | POA: Diagnosis not present

## 2021-07-22 NOTE — Progress Notes (Addendum)
- Patient was at home - Provider was a the office - 15 minutes spent for this visit - Patient agreed to visit via the telephone  Deanna Munoz, is a 55 y.o. female  NLZ:[id number]  TKW:[id number]  DOB - [birth year]  Subjective:  Chief Complaint and HPI: Deanna Munoz is a 54 y.o. female visited via telephone module.  Patient last physical visit was May 06, 2021, she was put on phentermine for weight loss, her blood pressure medications were discussed by the previous provider.  Patient reports well controlled blood pressure, her last readings were 127/78 was 2 days ago patient denies any chest pain shortness of breath or any other concern that the grieving process she is going through.  Not being advised by her health insurance nurse practitioner to request for Lexapro, patient says that she was visited by her insurance NP recently, they were discussing the grieving process she was going through to losing her daughter and her mother being diagnosed with cancer.  Patient herself believes that she is not depressed, is not sad all the time, denies any suicidal attempts or ideation ideations.  No suicidal thoughts either.  Chest pain shortness of breath or any other symptoms today.     ED/Hospital notes reviewed.   Social History: Reviewed Family history: Reviewed  ROS:   Constitutional:  No f/c, No night sweats, No unexplained weight loss. EENT:  No vision changes, No blurry vision, No hearing changes. No mouth, throat, or ear problems.  Respiratory: No cough, No SOB Cardiac: No CP, no palpitations GI:  No abd pain, No N/V/D. GU: No Urinary s/sx Musculoskeletal: No joint pain Neuro: No headache, no dizziness, no motor weakness.  Skin: No rash Endocrine:  No polydipsia. No polyuria.  Psych: Denies SI/HI  No problems updated.  ALLERGIES: No Known Allergies  PAST MEDICAL HISTORY: Past Medical History:  Diagnosis Date   Hypertension    Obesity     MEDICATIONS AT HOME: Prior  to Admission medications   Medication Sig Start Date End Date Taking? Authorizing Provider  albuterol (VENTOLIN HFA) 108 (90 Base) MCG/ACT inhaler Inhale 2 puffs into the lungs every 6 (six) hours as needed for wheezing or shortness of breath. 11/25/20  Yes Arvilla Market, MD  cetirizine (ZYRTEC) 10 MG tablet Take 10 mg by mouth daily.   Yes [provider]  chlorthalidone (HYGROTON) 50 MG tablet Take 1 tablet (50 mg total) by mouth daily. 05/20/21  Yes Arvilla Market, MD  montelukast (SINGULAIR) 10 MG tablet Take 1 tablet (10 mg total) by mouth at bedtime. 05/06/21  Yes Arvilla Market, MD  phentermine 37.5 MG capsule Take 1 capsule (37.5 mg total) by mouth every morning. 05/06/21  Yes Arvilla Market, MD  topiramate (TOPAMAX) 25 MG tablet Take 1 tablet (25 mg total) by mouth daily. 05/06/21  Yes Arvilla Market, MD     Objective:  EXAM:   There were no vitals filed for this visit.  No exam performed due to the nature of the visit   Assessment & Plan   1. Essential hypertension -Continue taking blood pressure medications as prescribed and schedule a physical exam in 4 weeks  2. Grieving -Report any suicidal thoughts plans and call 911.  Verbalized understanding - Ambulatory referral to Social Work     Patient have been counseled extensively about nutrition and exercise  No follow-ups on file.  The patient was given clear instructions to go to ER or return to medical center if  symptoms don't improve, worsen or new problems develop. The patient verbalized understanding. The patient was told to call to get lab results if they haven't heard anything in the next week.     Eleonore Chiquito, APRN, FNP-[facility] [place], [place] [phone]   07/22/2021, 9:04 AM

## 2021-07-22 NOTE — Progress Notes (Signed)
BP  Weight loss f/u  Discuss depression- start Lexapro or therapy  Daughter passed away in 10-11-2020

## 2021-07-26 ENCOUNTER — Telehealth: Payer: Self-pay | Admitting: Clinical

## 2021-08-04 NOTE — Telephone Encounter (Signed)
Called pt and scheduled virtual appt for 08/23/21 at 1:30

## 2021-08-05 ENCOUNTER — Encounter: Payer: Self-pay | Admitting: Family

## 2021-08-23 ENCOUNTER — Other Ambulatory Visit: Payer: Self-pay

## 2021-08-23 ENCOUNTER — Ambulatory Visit (INDEPENDENT_AMBULATORY_CARE_PROVIDER_SITE_OTHER): Payer: 59 | Admitting: Clinical

## 2021-08-23 DIAGNOSIS — F4323 Adjustment disorder with mixed anxiety and depressed mood: Secondary | ICD-10-CM

## 2021-08-23 NOTE — BH Specialist Note (Signed)
Integrated Behavioral Health via Telemedicine Visit  08/23/2021 Deanna Munoz [id number]  Number of Integrated Behavioral Health visits: 1/6 Session Start time: 1:42pm  Session End time: 2:42pm Total time: 60  Referring Provider: Eleonore Chiquito, FNP Patient/Family location: Home [facility] Provider location: [facility] All persons participating in visit: Pt and LCSWA Types of Service: Individual psychotherapy and Video visit  I connected with Deanna Munoz via [employer]  (Video is [employer], [employer]) and verified that I am speaking with the correct person using two identifiers. Discussed confidentiality: Yes   I discussed the limitations of telemedicine and the availability of in person appointments.  Discussed there is a possibility of technology failure and discussed alternative modes of communication if that failure occurs.  I discussed that engaging in this telemedicine visit, they consent to the provision of behavioral healthcare and the services will be billed under their insurance.  Patient and/or legal guardian expressed understanding and consented to Telemedicine visit: Yes   Presenting Concerns: Patient and/or family reports the following symptoms/concerns: Pt reports feeling depressed, difficulty sleeping, decreased energy, anxiousness, excessive worrying, irritability, and restlessness. Reports that she is experiencing grief due to her daughter passing away last year due to a drug overdose and her father-in-law passing away one week ago. Reports experiencing feelings of guilt. Reports that she is also worried about her mother's health due to a potential of her mother having cancer. Reports drinking alcohol daily for about 6 months to assist with sleep but has recently decreased alcohol use to 1-2 times/week.  Duration of problem: 1 year; Severity of problem: moderate  Patient and/or Family's Strengths/Protective Factors: Concrete supports in  place (healthy food, safe environments, etc.) and Sense of purpose  Goals Addressed: Patient will:  Reduce symptoms of: anxiety, depression, and stress   Increase knowledge and/or ability of: coping skills   Demonstrate ability to: Increase healthy adjustment to current life circumstances and Begin healthy grieving over loss  Progress towards Goals: Ongoing  Interventions: Interventions utilized:  Mindfulness or Management consultant, CBT Cognitive Behavioral Therapy, Supportive Counseling, Sleep Hygiene, and Psychoeducation and/or Health Education Standardized Assessments completed:  MDQ, GAD-7, and PHQ 9  Patient and/or Family Response: Pt receptive to tx. Pt receptive to psychoeducation provided on grief, anxiety, and depression. Pt receptive to cognitive restructuring utilized to decrease pt's feelings of guilt. Pt will begin utilizing deep breathing exercises and journal to assist with grief and relaxation.   Assessment: MDQ was negative. Denies SI/HI. Denies auditory/visual hallucinations. Patient currently experiencing depression and anxiety related to the loss of her daughter and father-in-law. Pt continues to experience guilt related to the loss of her daughter. Pt cares for her grandson since her daughter passed away. Pt also appears stressed and overwhelmed due to her mother's health. Pt appears to have adequate support system with her husband but continues to try to be "strong" for others.    Patient may benefit from brief therapy to assist with grief. LCSWA provided psychoeducation on the stages of grief. LCSWA provided psychoeducation on depression and anxiety. LCSWA attempted to normalize alcohol use as a coping skill and provided psychoeducation. LCSWA utilized cognitive retructuring to decrease pt feelings of guilt. LCSWA encouraged pt to utilize deep breathing exercises and a grief journal. LCSWA will fu with pt.  Plan: Follow up with behavioral health clinician on :  09/13/21 Behavioral recommendations: Utilize deep breathing exercises and a grief journal Referral(s): Integrated [employer] (In Clinic)  I discussed the assessment and treatment plan with the  patient and/or parent/guardian. They were provided an opportunity to ask questions and all were answered. They agreed with the plan and demonstrated an understanding of the instructions.   They were advised to call back or seek an in-person evaluation if the symptoms worsen or if the condition fails to improve as anticipated.  Danie Diehl C Andrianna Manalang, LCSW

## 2021-08-24 ENCOUNTER — Telehealth: Payer: Self-pay | Admitting: Internal Medicine

## 2021-08-24 NOTE — Telephone Encounter (Signed)
Called patient so we can get her a telephone appointment scheduled so she can get her medication (topiramate 25mg ) refilled. LVM to have patient call the office back.

## 2021-09-13 ENCOUNTER — Encounter: Payer: 59 | Admitting: Clinical

## 2021-10-11 ENCOUNTER — Encounter: Payer: 59 | Admitting: Clinical

## 2021-12-02 ENCOUNTER — Other Ambulatory Visit: Payer: Self-pay | Admitting: Internal Medicine

## 2021-12-02 DIAGNOSIS — I10 Essential (primary) hypertension: Secondary | ICD-10-CM

## 2022-01-27 ENCOUNTER — Encounter: Payer: Self-pay | Admitting: Family Medicine

## 2022-01-27 ENCOUNTER — Ambulatory Visit (INDEPENDENT_AMBULATORY_CARE_PROVIDER_SITE_OTHER): Payer: 59 | Admitting: Family Medicine

## 2022-01-27 ENCOUNTER — Other Ambulatory Visit: Payer: Self-pay

## 2022-01-27 DIAGNOSIS — F4321 Adjustment disorder with depressed mood: Secondary | ICD-10-CM | POA: Diagnosis not present

## 2022-01-27 DIAGNOSIS — I10 Essential (primary) hypertension: Secondary | ICD-10-CM | POA: Diagnosis not present

## 2022-01-27 DIAGNOSIS — R69 Illness, unspecified: Secondary | ICD-10-CM | POA: Diagnosis not present

## 2022-01-27 MED ORDER — MIRTAZAPINE 15 MG PO TABS: 15.0000 mg | ORAL_TABLET | Freq: Every day | ORAL | 0 refills | Status: DC

## 2022-01-27 MED ORDER — CHLORTHALIDONE 50 MG PO TABS: 50.0000 mg | ORAL_TABLET | Freq: Every day | ORAL | 1 refills | Status: DC

## 2022-01-27 NOTE — Progress Notes (Signed)
Established  Patient Office Visit  Subjective:  Patient ID: Deanna Munoz, female    DOB: [birth year]  Age: 56 y.o. MRN: [id number]  CC:  Chief Complaint  Patient presents with   Medication Refill   Annual Exam    HPI Deanna Munoz presents for follow up of hypertension. Patient reports that she has been having difficulties coping with recent deaths of her mom, dad and daughter and now raising her 70 yo grandson. She has been trying to maintain by keeping busy with work.   Past Medical History:  Diagnosis Date   Hypertension    Obesity     No past surgical history on file.  No family history on file.  Social History   Socioeconomic History   Marital status: Married    Spouse name: Not on file   Number of children: Not on file   Years of education: Not on file   Highest education level: Not on file  Occupational History   Not on file  Tobacco Use   Smoking status: Never   Smokeless tobacco: Never  Substance and Sexual Activity   Alcohol use: No   Drug use: No   Sexual activity: Not on file  Other Topics Concern   Not on file  Social History Narrative   Not on file   Social Determinants of Health   Financial Resource Strain: Not on file  Food Insecurity: Not on file  Transportation Needs: Not on file  Physical Activity: Not on file  Stress: Not on file  Social Connections: Not on file  Intimate Partner Violence: Not on file    ROS Review of Systems  Psychiatric/Behavioral:  Positive for sleep disturbance. Negative for self-injury and suicidal ideas. The patient is not nervous/anxious.   All other systems reviewed and are negative.  Objective:   Today's Vitals: There were no vitals taken for this visit.  Physical Exam Vitals and nursing note reviewed.  Constitutional:      General: She is not in acute distress.    Appearance: She is obese.  Cardiovascular:     Rate and Rhythm: Normal rate and regular rhythm.  Pulmonary:     Effort:  Pulmonary effort is normal.     Breath sounds: Normal breath sounds.  Neurological:     General: No focal deficit present.     Mental Status: She is alert and oriented to person, place, and time.  Psychiatric:        Mood and Affect: Mood is depressed. Affect is tearful.        Behavior: Behavior normal.    Assessment & Plan:   1. Situational depression Remeron prescribed. Referral to [facility] for counseling.   2. Essential hypertension Discussed compliance. Meds refilled. Will monitor - chlorthalidone (HYGROTON) 50 MG tablet; Take 1 tablet (50 mg total) by mouth daily.  Dispense: 90 tablet; Refill: 1    Outpatient Encounter Medications as of 01/27/2022  Medication Sig   albuterol (VENTOLIN HFA) 108 (90 Base) MCG/ACT inhaler Inhale 2 puffs into the lungs every 6 (six) hours as needed for wheezing or shortness of breath.   cetirizine (ZYRTEC) 10 MG tablet Take 10 mg by mouth daily.   mirtazapine (REMERON) 15 MG tablet Take 1 tablet (15 mg total) by mouth at bedtime.   montelukast (SINGULAIR) 10 MG tablet Take 1 tablet (10 mg total) by mouth at bedtime.   phentermine 37.5 MG capsule Take 1 capsule (37.5 mg total) by mouth every morning.   topiramate (TOPAMAX)  25 MG tablet Take 1 tablet (25 mg total) by mouth daily.   [DISCONTINUED] chlorthalidone (HYGROTON) 50 MG tablet Take 1 tablet (50 mg total) by mouth daily.   chlorthalidone (HYGROTON) 50 MG tablet Take 1 tablet (50 mg total) by mouth daily.   No facility-administered encounter medications on file as of 01/27/2022.    Follow-up: Return in about 4 weeks (around 02/24/2022) for follow up, physical.   [name], MD

## 2022-01-27 NOTE — Progress Notes (Signed)
Patient is here for medication refill and CPE  Patient has been going through something since her child passed

## 2022-02-14 ENCOUNTER — Other Ambulatory Visit: Payer: Self-pay | Admitting: Family Medicine

## 2022-02-14 DIAGNOSIS — Z1231 Encounter for screening mammogram for malignant neoplasm of breast: Secondary | ICD-10-CM

## 2022-02-21 ENCOUNTER — Ambulatory Visit
Admission: RE | Admit: 2022-02-21 | Discharge: 2022-02-21 | Disposition: A | Discharge status: Home / Self Care | Payer: 59 | Source: Ambulatory Visit | Attending: Family Medicine | Admitting: Family Medicine

## 2022-02-21 DIAGNOSIS — Z1231 Encounter for screening mammogram for malignant neoplasm of breast: Secondary | ICD-10-CM | POA: Diagnosis not present

## 2022-02-28 ENCOUNTER — Other Ambulatory Visit: Payer: Self-pay

## 2022-02-28 ENCOUNTER — Ambulatory Visit (INDEPENDENT_AMBULATORY_CARE_PROVIDER_SITE_OTHER): Payer: 59 | Admitting: Clinical

## 2022-02-28 DIAGNOSIS — F411 Generalized anxiety disorder: Secondary | ICD-10-CM

## 2022-02-28 DIAGNOSIS — R69 Illness, unspecified: Secondary | ICD-10-CM | POA: Diagnosis not present

## 2022-02-28 DIAGNOSIS — F331 Major depressive disorder, recurrent, moderate: Secondary | ICD-10-CM

## 2022-02-28 NOTE — Patient Instructions (Signed)
Utilize journaling ?Take 20-30 minutes of "self-care" everyday ?Utilize deep breathing ?Call Erman Thum' Helen Cuff, LCSW if any concerns arise with current grief counseling [phone] ?

## 2022-03-01 NOTE — BH Specialist Note (Signed)
Integrated Behavioral Health Follow Up In-Person Visit ? ?MRN: [id number] ?Name: Deanna Munoz ? ?Number of Integrated Behavioral Health Clinician visits: 2- Second Visit ? ?Session Start time: 14 ?  ?Session End time: 0930 ? ?Total time in minutes: 50 ? ? ?Types of Service: Individual psychotherapy ? ?Interpretor:No. Interpretor Name and Language: N/A ? ?Subjective: ?Deanna Munoz is a 56 y.o. female accompanied by  self ?Patient was referred by Georganna Skeans, MD for depression. ?Patient reports the following symptoms/concerns: Reports feeling depressed, trouble sleeping, decreased energy, self-esteem disturbances, decreased appetite, worrying, trouble relaxing, and irritability. Reports that she continues to experience grief related to the death of her daughter and father-in-law. Reports that her mother also died since last visit in 09-23-21. Reports that she continues to care for her grandson. Reports that she frequently helps others and has minimal self-care.  ?Duration of problem: 1 year; Severity of problem: moderate ? ?Objective: ?Mood: Depressed and Affect: Appropriate ?Risk of harm to self or others: No plan to harm self or others ? ?Life Context: ?Family and Social: Pt is married and cares for her grandson. Pt's daughter died and she has a strained relationship with her son. ?School/Work: Pt is employed.  ?Self-Care: Pt has limited self-care. Pt drinks alcohol 1-2 times/week.  ?Life Changes: Pt's mother, father-in-law, and daughter died.  ? ?Patient and/or Family's Strengths/Protective Factors: ?Concrete supports in place (healthy food, safe environments, etc.) and Sense of purpose ? ?Goals Addressed: ?Patient will: ? Reduce symptoms of: anxiety, depression, and stress  ? Increase knowledge and/or ability of: coping skills  ? Demonstrate ability to: Increase healthy adjustment to current life circumstances and Begin healthy grieving over loss ? ?Progress towards  Goals: ?Ongoing ? ?Interventions: ?Interventions utilized:  Copywriter, advertising, CBT Cognitive Behavioral Therapy, and Supportive Counseling ?Standardized Assessments completed: GAD-7 and PHQ 9 ?Depression screen [facility] 2/9 02/28/2022 01/27/2022 08/23/2021 07/22/2021 05/06/2021  ?Decreased Interest 1 1 3 2 1   ?Down, Depressed, Hopeless 2 1 1 2  0  ?PHQ - 2 Score 3 2 4 4 1   ?Altered sleeping 3 1 3 2 3   ?Tired, decreased energy 2 1 1 2 1   ?Change in appetite 3 0 2 1 0  ?Feeling bad or failure about yourself  2 0 1 2 0  ?Trouble concentrating 0 1 1 0 0  ?Moving slowly or fidgety/restless 0 0 0 0 0  ?Suicidal thoughts 0 0 0 0 0  ?PHQ-9 Score 13 5 12 11 5   ?Difficult doing work/chores - Somewhat difficult - - Not difficult at all  ?  ?GAD 7 : Generalized Anxiety Score 02/28/2022 08/23/2021 07/22/2021 02/24/2021  ?Nervous, Anxious, on Edge 0 3 1 0  ?Control/stop worrying 2 3 2  0  ?Worry too much - different things 2 2 2 1   ?Trouble relaxing 1 3 0 0  ?Restless 0 2 0 0  ?Easily annoyed or irritable 1 2 0 1  ?Afraid - awful might happen 0 3 0 0  ?Total GAD 7 Score 6 18 5 2   ? ?Patient and/or Family Response: Pt receptive to tx. Pt receptive to psychoeducation provided on grief, depression, and anxiety. Pt receptive to cognitive restructuring to decrease unhelpful thoughts. Pt receptive to meditation, deep breathing, and incorporating daily self-care.  ? ?Patient Centered Plan: ?Patient is on the following Treatment Plan(s): Grief, depression, and anxiety ? ?Assessment: ?Denies SI/HI. Patient currently experiencing depression and anxiety related to grief. Pt continues to feel overwhelmed with helping others and has minimal time for herself.  Pt has decreased alcohol use however has minimal coping skills. Pt also appears to experience racing thoughts which contribute to trouble sleeping.  ? ?Patient may benefit from grief counseling as pt mentioned that she already is established in grief counseling. LCSW provided psychoeducation  on grief, depression, and anxiety. LCSW utilized cognitive restructuring to decrease unhelpful thoughts. LCSW encouraged pt to incorporate daily self-care, meditation, and deep breathing. LCSW also encouraged pt to utilize journaling. LCSW encouraged pt to continue grief counseling and to fu if needed. ? ?Plan: ?Follow up with behavioral health clinician on : PRN ?Behavioral recommendations: Utilize deep breathing exercises, meditation, and incorporate daily self-care. ?Referral(s): Integrated [facility] (In Clinic) and Counselor ?"From scale of 1-10, how likely are you to follow plan?": 10 ? ?Gervis Gaba C Latrelle Fuston, LCSW ? ? ?

## 2022-03-02 ENCOUNTER — Other Ambulatory Visit: Payer: Self-pay | Admitting: Internal Medicine

## 2022-03-16 ENCOUNTER — Encounter: Payer: Self-pay | Admitting: Family Medicine

## 2022-03-16 ENCOUNTER — Ambulatory Visit (INDEPENDENT_AMBULATORY_CARE_PROVIDER_SITE_OTHER): Payer: 59 | Admitting: Family Medicine

## 2022-03-16 VITALS — BP 124/85 | HR 77 | Temp 98.2°F | Resp 16 | Ht 64.5 in | Wt 261.8 lb

## 2022-03-16 DIAGNOSIS — Z13220 Encounter for screening for lipoid disorders: Secondary | ICD-10-CM | POA: Diagnosis not present

## 2022-03-16 DIAGNOSIS — Z13228 Encounter for screening for other metabolic disorders: Secondary | ICD-10-CM | POA: Diagnosis not present

## 2022-03-16 DIAGNOSIS — Z1329 Encounter for screening for other suspected endocrine disorder: Secondary | ICD-10-CM | POA: Diagnosis not present

## 2022-03-16 DIAGNOSIS — Z0000 Encounter for general adult medical examination without abnormal findings: Secondary | ICD-10-CM

## 2022-03-16 DIAGNOSIS — Z130 Encounter for screening for diseases of the blood and blood-forming organs and certain disorders involving the immune mechanism: Secondary | ICD-10-CM | POA: Diagnosis not present

## 2022-03-16 DIAGNOSIS — Z1211 Encounter for screening for malignant neoplasm of colon: Secondary | ICD-10-CM

## 2022-03-16 MED ORDER — MIRTAZAPINE 30 MG PO TABS: 30.0000 mg | ORAL_TABLET | Freq: Every day | ORAL | 2 refills | Status: DC

## 2022-03-16 MED ORDER — MONTELUKAST SODIUM 10 MG PO TABS: 10.0000 mg | ORAL_TABLET | Freq: Every day | ORAL | 1 refills | Status: DC

## 2022-03-16 MED ORDER — ALBUTEROL SULFATE HFA 108 (90 BASE) MCG/ACT IN AERS: 2.0000 | INHALATION_SPRAY | Freq: Four times a day (QID) | RESPIRATORY_TRACT | 2 refills | Status: DC | PRN

## 2022-03-16 MED ORDER — CHLORTHALIDONE 50 MG PO TABS: 50.0000 mg | ORAL_TABLET | Freq: Every day | ORAL | 1 refills | Status: DC

## 2022-03-16 NOTE — Progress Notes (Signed)
? ?Established Patient Office Visit ? ?Subjective:  ?Patient ID: Deanna Munoz, female    DOB: February 17, 1966  Age: 56 y.o. MRN: [id number] ? ?CC:  ?Chief Complaint  ?Patient presents with  ? Annual Exam  ? Medication Refill  ? Hypertension  ? ? ?HPI ?Deanna Munoz presents for routine annual exam with med refills. Patient denies acute complaints or concerns.  ? ?Past Medical History:  ?Diagnosis Date  ? Hypertension   ? Obesity   ? ? ?No past surgical history on file. ? ?No family history on file. ? ?Social History  ? ?Socioeconomic History  ? Marital status: Married  ?  Spouse name: Not on file  ? Number of children: Not on file  ? Years of education: Not on file  ? Highest education level: Not on file  ?Occupational History  ? Not on file  ?Tobacco Use  ? Smoking status: Never  ? Smokeless tobacco: Never  ?Substance and Sexual Activity  ? Alcohol use: No  ? Drug use: No  ? Sexual activity: Not on file  ?Other Topics Concern  ? Not on file  ?Social History Narrative  ? Not on file  ? ?Social Determinants of Health  ? ?Financial Resource Strain: Not on file  ?Food Insecurity: Not on file  ?Transportation Needs: Not on file  ?Physical Activity: Not on file  ?Stress: Not on file  ?Social Connections: Not on file  ?Intimate Partner Violence: Not on file  ? ? ?ROS ?Review of Systems  ?All other systems reviewed and are negative. ? ?Objective:  ? ?Today's Vitals: BP 124/85   Pulse 77   Temp 98.2 ?F (36.8 ?C) (Oral)   Resp 16   Ht 5' 4.5" (1.638 m)   Wt 261 lb 12.8 oz (118.8 kg)   SpO2 94%   BMI 44.24 kg/m?  ? ?Physical Exam ?Vitals and nursing note reviewed.  ?Constitutional:   ?   General: She is not in acute distress. ?Cardiovascular:  ?   Rate and Rhythm: Normal rate and regular rhythm.  ?Pulmonary:  ?   Effort: Pulmonary effort is normal.  ?   Breath sounds: Normal breath sounds.  ?Abdominal:  ?   Palpations: Abdomen is soft.  ?   Tenderness: There is no abdominal tenderness.  ?Neurological:  ?   General: No  focal deficit present.  ?   Mental Status: She is alert and oriented to person, place, and time.  ? ? ?Assessment & Plan:  ? ?1. Annual physical exam ?Routine labs ordered ?- CMP14+EGFR ? ?2. Screening for colon cancer ?Referral for cologuard ?- Cologuard ? ?3. Screening for deficiency anemia ? ?- CBC with Differential ? ?4. Screening for endocrine/metabolic/immunity disorders ? ?- TSH ?- Vitamin D, 25-hydroxy ?- Hemoglobin A1c ? ?5. Screening for lipid disorders ? ?- Lipid Panel ? ?Outpatient Encounter Medications as of 03/16/2022  ?Medication Sig  ? cetirizine (ZYRTEC) 10 MG tablet Take 10 mg by mouth daily.  ? mirtazapine (REMERON) 30 MG tablet Take 1 tablet (30 mg total) by mouth at bedtime.  ? phentermine 37.5 MG capsule Take 1 capsule (37.5 mg total) by mouth every morning.  ? topiramate (TOPAMAX) 25 MG tablet Take 1 tablet (25 mg total) by mouth daily.  ? [DISCONTINUED] albuterol (VENTOLIN HFA) 108 (90 Base) MCG/ACT inhaler Inhale 2 puffs into the lungs every 6 (six) hours as needed for wheezing or shortness of breath.  ? [DISCONTINUED] chlorthalidone (HYGROTON) 50 MG tablet Take 1 tablet by mouth once daily  ? [  DISCONTINUED] mirtazapine (REMERON) 15 MG tablet Take 1 tablet (15 mg total) by mouth at bedtime.  ? [DISCONTINUED] montelukast (SINGULAIR) 10 MG tablet Take 1 tablet (10 mg total) by mouth at bedtime.  ? albuterol (VENTOLIN HFA) 108 (90 Base) MCG/ACT inhaler Inhale 2 puffs into the lungs every 6 (six) hours as needed for wheezing or shortness of breath.  ? chlorthalidone (HYGROTON) 50 MG tablet Take 1 tablet (50 mg total) by mouth daily.  ? montelukast (SINGULAIR) 10 MG tablet Take 1 tablet (10 mg total) by mouth at bedtime.  ? ?No facility-administered encounter medications on file as of 03/16/2022.  ? ? ?Follow-up: No follow-ups on file.  ? ?Becky Sax, MD ? ?

## 2022-03-16 NOTE — Progress Notes (Signed)
Patient said that she do not think her medication mirtazapine is working. Patient said that she still has restless nights.Patient has been taking OTC sleep aides ?

## 2022-03-17 LAB — CMP14+EGFR
ALT: 11 IU/L (ref 0–32)
AST: 17 IU/L (ref 0–40)
Albumin/Globulin Ratio: 1.3 (ref 1.2–2.2)
Albumin: 4.5 g/dL (ref 3.8–4.9)
Alkaline Phosphatase: 105 IU/L (ref 44–121)
BUN/Creatinine Ratio: 10 (ref 9–23)
BUN: 9 mg/dL (ref 6–24)
Bilirubin Total: 0.4 mg/dL (ref 0.0–1.2)
CO2: 28 mmol/L (ref 20–29)
Calcium: 10.3 mg/dL — ABNORMAL HIGH (ref 8.7–10.2)
Chloride: 94 mmol/L — ABNORMAL LOW (ref 96–106)
Creatinine, Ser: 0.93 mg/dL (ref 0.57–1.00)
Globulin, Total: 3.4 g/dL (ref 1.5–4.5)
Glucose: 86 mg/dL (ref 70–99)
Potassium: 3.9 mmol/L (ref 3.5–5.2)
Sodium: 135 mmol/L (ref 134–144)
Total Protein: 7.9 g/dL (ref 6.0–8.5)
eGFR: 73 mL/min/{1.73_m2} (ref >59)

## 2022-03-17 LAB — CBC WITH DIFFERENTIAL/PLATELET
Basophils Absolute: 0 10*3/uL (ref 0.0–0.2)
Basos: 0 %
EOS (ABSOLUTE): 0.6 10*3/uL — ABNORMAL HIGH (ref 0.0–0.4)
Eos: 9 %
Hematocrit: 37.7 % (ref 34.0–46.6)
Hemoglobin: 13 g/dL (ref 11.1–15.9)
Immature Grans (Abs): 0 10*3/uL (ref 0.0–0.1)
Immature Granulocytes: 0 %
Lymphocytes Absolute: 2 10*3/uL (ref 0.7–3.1)
Lymphs: 28 %
MCH: 29 pg (ref 26.6–33.0)
MCHC: 34.5 g/dL (ref 31.5–35.7)
MCV: 84 fL (ref 79–97)
Monocytes Absolute: 0.4 10*3/uL (ref 0.1–0.9)
Monocytes: 6 %
Neutrophils Absolute: 4.3 10*3/uL (ref 1.4–7.0)
Neutrophils: 57 %
Platelets: 303 10*3/uL (ref 150–450)
RBC: 4.48 x10E6/uL (ref 3.77–5.28)
RDW: 13.2 % (ref 11.7–15.4)
WBC: 7.4 10*3/uL (ref 3.4–10.8)

## 2022-03-17 LAB — HEMOGLOBIN A1C
Est. average glucose Bld gHb Est-mCnc: 114 mg/dL
Hgb A1c MFr Bld: 5.6 % (ref 4.8–5.6)

## 2022-03-17 LAB — LIPID PANEL
Chol/HDL Ratio: 3.7 ratio (ref 0.0–4.4)
Cholesterol, Total: 182 mg/dL (ref 100–199)
HDL: 49 mg/dL (ref >39)
LDL Chol Calc (NIH): 112 mg/dL — ABNORMAL HIGH (ref 0–99)
Triglycerides: 114 mg/dL (ref 0–149)
VLDL Cholesterol Cal: 21 mg/dL (ref 5–40)

## 2022-03-17 LAB — VITAMIN D 25 HYDROXY (VIT D DEFICIENCY, FRACTURES): Vit D, 25-Hydroxy: 68.3 ng/mL (ref 30.0–100.0)

## 2022-03-17 LAB — TSH: TSH: 2.11 u[IU]/mL (ref 0.450–4.500)

## 2022-03-27 DIAGNOSIS — Z1211 Encounter for screening for malignant neoplasm of colon: Secondary | ICD-10-CM | POA: Diagnosis not present

## 2022-04-05 LAB — COLOGUARD: COLOGUARD: NEGATIVE

## 2022-09-19 ENCOUNTER — Encounter (HOSPITAL_COMMUNITY): Payer: Self-pay | Admitting: *Deleted

## 2022-09-19 ENCOUNTER — Ambulatory Visit (INDEPENDENT_AMBULATORY_CARE_PROVIDER_SITE_OTHER): Payer: 59

## 2022-09-19 ENCOUNTER — Ambulatory Visit (HOSPITAL_COMMUNITY)
Admission: EM | Admit: 2022-09-19 | Discharge: 2022-09-19 | Disposition: A | Discharge status: Home / Self Care | Payer: 59 | Attending: Physician Assistant | Admitting: Physician Assistant

## 2022-09-19 ENCOUNTER — Ambulatory Visit: Payer: Self-pay

## 2022-09-19 DIAGNOSIS — M7989 Other specified soft tissue disorders: Secondary | ICD-10-CM | POA: Diagnosis not present

## 2022-09-19 DIAGNOSIS — M25562 Pain in left knee: Secondary | ICD-10-CM

## 2022-09-19 DIAGNOSIS — M79605 Pain in left leg: Secondary | ICD-10-CM

## 2022-09-19 MED ORDER — KETOROLAC TROMETHAMINE 30 MG/ML IJ SOLN
30.0000 mg | Freq: Once | INTRAMUSCULAR | Status: AC
  Administered 2022-09-19: 30 mg via INTRAMUSCULAR

## 2022-09-19 MED ORDER — KETOROLAC TROMETHAMINE 30 MG/ML IJ SOLN
INTRAMUSCULAR | Status: AC
  Filled 2022-09-19: qty 1

## 2022-09-19 MED ORDER — METHOCARBAMOL 500 MG PO TABS: 500.0000 mg | ORAL_TABLET | Freq: Two times a day (BID) | ORAL | 0 refills | Status: DC

## 2022-09-19 NOTE — Discharge Instructions (Addendum)
Your x-ray was negative.  Please go get your DVT study tomorrow.  We will contact you if this is abnormal.  We gave an injection of Toradol today.  Please do not take any NSAIDs including aspirin, ibuprofen/Advil, naproxen/Aleve for the next 12 hours.  You can use Tylenol.  I have called in Robaxin.  This will make you sleepy.  Do not drive or drink alcohol with taking it.  If anything worsens you need to be seen immediately including shortness of breath, chest pain, heart racing, increased pain.

## 2022-09-19 NOTE — Telephone Encounter (Signed)
FYI

## 2022-09-19 NOTE — Telephone Encounter (Signed)
  Chief Complaint: leg pain Symptoms: L knee and leg pain on the back 9/10 Frequency: 1 month Pertinent Negatives: Patient denies numbness or tingling Disposition: [] ED /[x] Urgent Care (no appt availability in office) / [] Appointment(In office/virtual)/ []  [facility]/ [] Home Care/ [] Refused Recommended Disposition /[]  Mobile Bus/ []  Follow-up with PCP Additional Notes: pt has been using lidocaine patches and Tylenol and heating pad and nothing helps much with pain. No appts available until 09/26/22. Tried to offer MU but pt preferred if pt got worse she would just go to UC. Scheduled appt at UC at 1900 this evening.   Reason for Disposition  [1] MODERATE pain (e.g., interferes with normal activities, limping) AND [2] present > 3 days  Answer Assessment - Initial Assessment Questions 1. ONSET: "When did the pain start?"      1 month  2. LOCATION: "Where is the pain located?"      L knee and back of L leg  3. PAIN: "How bad is the pain?"    (Scale 1-10; or mild, moderate, severe)   -  MILD (1-3): doesn't interfere with normal activities    -  MODERATE (4-7): interferes with normal activities (e.g., work or school) or awakens from sleep, limping    -  SEVERE (8-10): excruciating pain, unable to do any normal activities, unable to walk     9 6. OTHER SYMPTOMS: "Do you have any other symptoms?" (e.g., chest pain, back pain, breathing difficulty, swelling, rash, fever, numbness, weakness)     Swelling  Protocols used: Leg Pain-A-AH

## 2022-09-19 NOTE — ED Triage Notes (Signed)
Pt states that she is having left leg pain and swelling behind her knee. She states been hurting awhile but it has been this bad since yesterday. She is using lidocaine patches, tylenol, mobic (old RX).

## 2022-09-19 NOTE — ED Provider Notes (Signed)
[facility]    CSN: [id number] Arrival date & time: 09/19/22  1853      History   Chief Complaint Chief Complaint  Patient presents with   Leg Pain    HPI Deanna Munoz is a 56 y.o. female.   Patient presents today with a month-long history of intermittent left leg pain.  She reports that this had been improving until recurrence a few days ago.  Pain is rated 10 on a 0-10 pain scale, localized to posterior lateral thigh with radiation to the knee, described as sharp and aching, no aggravating relieving factors identified.  She has tried Tylenol and ibuprofen without improvement of symptoms.  She denies any known injury, increase in activity.  She reports that initially she thought it was just a charley horse but it has been persistent and worsening.  She is having difficulty bearing weight as result of symptoms.  She denies any history of VTE event or significant risk factors including immobilization, recent hospitalization, surgical procedure, malignancy, exogenous hormone use, recent COVID-19 infection.  Denies chest pain, shortness of breath, palpitations.  She does report some leg swelling since symptoms began.    Past Medical History:  Diagnosis Date   Hypertension    Obesity     Patient Active Problem List   Diagnosis Date Noted   Class 3 severe obesity with serious comorbidity in adult [facility]) 05/06/2021   Essential hypertension 09/16/2020    History reviewed. No pertinent surgical history.  OB History   No obstetric history on file.      Home Medications    Prior to Admission medications   Medication Sig Start Date End Date Taking? Authorizing Provider  albuterol (VENTOLIN HFA) 108 (90 Base) MCG/ACT inhaler Inhale 2 puffs into the lungs every 6 (six) hours as needed for wheezing or shortness of breath. 03/16/22  Yes Dorna Mai, MD  cetirizine (ZYRTEC) 10 MG tablet Take 10 mg by mouth daily.   Yes [provider]  chlorthalidone  (HYGROTON) 50 MG tablet Take 1 tablet (50 mg total) by mouth daily. 03/16/22  Yes Dorna Mai, MD  methocarbamol (ROBAXIN) 500 MG tablet Take 1 tablet (500 mg total) by mouth 2 (two) times daily. 09/19/22  Yes Devansh Riese K, PA-C  mirtazapine (REMERON) 30 MG tablet Take 1 tablet (30 mg total) by mouth at bedtime. 03/16/22  Yes Dorna Mai, MD  montelukast (SINGULAIR) 10 MG tablet Take 1 tablet (10 mg total) by mouth at bedtime. 03/16/22  Yes Dorna Mai, MD    Family History History reviewed. No pertinent family history.  Social History Social History   Tobacco Use   Smoking status: Never   Smokeless tobacco: Never  Substance Use Topics   Alcohol use: No   Drug use: No     Allergies   Patient has no known allergies.   Review of Systems Review of Systems  Constitutional:  Positive for activity change. Negative for appetite change, fatigue and fever.  Respiratory:  Negative for cough and shortness of breath.   Cardiovascular:  Positive for leg swelling. Negative for chest pain and palpitations.  Gastrointestinal:  Negative for abdominal pain, diarrhea, nausea and vomiting.  Musculoskeletal:  Positive for myalgias. Negative for arthralgias.     Physical Exam Triage Vital Signs ED Triage Vitals  Enc Vitals Group     BP 09/19/22 1914 (!) 140/100     Pulse Rate 09/19/22 1914 (!) 58     Resp 09/19/22 1914 18  Temp 09/19/22 1914 98.3 F (36.8 C)     Temp Source 09/19/22 1914 Oral     SpO2 09/19/22 1914 99 %     Weight --      Height --      Head Circumference --      Peak Flow --      Pain Score 09/19/22 1908 10     Pain Loc --      Pain Edu? --      Excl. in GC? --    No data found.  Updated Vital Signs BP (!) 140/100 (BP Location: Left Arm)   Pulse (!) 58   Temp 98.3 F (36.8 C) (Oral)   Resp 18   SpO2 99%   Visual Acuity Right Eye Distance:   Left Eye Distance:   Bilateral Distance:    Right Eye Near:   Left Eye Near:    Bilateral Near:      Physical Exam Vitals reviewed.  Constitutional:      General: She is awake. She is not in acute distress.    Appearance: Normal appearance. She is well-developed. She is not ill-appearing.     Comments: Very pleasant female appears stated age in no acute distress sitting comfortably in exam room  HENT:     Head: Normocephalic and atraumatic.  Cardiovascular:     Rate and Rhythm: Normal rate and regular rhythm.     Heart sounds: Normal heart sounds, S1 normal and S2 normal. No murmur heard.    Comments: Negative Denna Haggard' sign on left. Pulmonary:     Effort: Pulmonary effort is normal.     Breath sounds: Normal breath sounds. No wheezing, rhonchi or rales.     Comments: Clear to auscultation bilaterally Abdominal:     General: Bowel sounds are normal.     Palpations: Abdomen is soft.     Tenderness: There is no abdominal tenderness. There is no right CVA tenderness, left CVA tenderness, guarding or rebound.  Musculoskeletal:     Left knee: Swelling present. Decreased range of motion. Tenderness present over the medial joint line and lateral joint line. No LCL laxity, MCL laxity, ACL laxity or PCL laxity.    Right lower leg: 1+ Edema present.     Left lower leg: 1+ Edema present.     Comments: Left knee: Tenderness palpation of popliteal fossa and along inferior joint line.  No deformity noted.  No ligamentous laxity on exam.  Psychiatric:        Behavior: Behavior is cooperative.      UC Treatments / Results  Labs (all labs ordered are listed, but only abnormal results are displayed) Labs Reviewed - No data to display  EKG   Radiology DG Knee Complete 4 Views Left  Result Date: 09/19/2022 CLINICAL DATA:  Pain and swelling EXAM: LEFT KNEE - COMPLETE 4+ VIEW COMPARISON:  None Available. FINDINGS: No evidence of fracture, dislocation, or joint effusion. No evidence of arthropathy or other focal bone abnormality. Soft tissues are unremarkable. IMPRESSION: Negative.  Electronically Signed   By: Darliss Cheney M.D.   On: 09/19/2022 20:03    Procedures Procedures (including critical care time)  Medications Ordered in UC Medications  ketorolac (TORADOL) 30 MG/ML injection 30 mg (30 mg Intramuscular Given 09/19/22 1945)    Initial Impression / Assessment and Plan / UC Course  I have reviewed the triage vital signs and the nursing notes.  Pertinent labs & imaging results that were available during my care of  the patient were reviewed by me and considered in my medical decision making (see chart for details).     Patient is well-appearing, afebrile, nontoxic, nontachycardic.  X-ray was obtained given severity of pain which showed no acute osseous abnormality.  Low suspicion for DVT, however, given leg swelling and pain will obtain outpatient study.  Patient was given instruction to report to emergency tomorrow at 11:00 AM.  She was given Toradol injection in clinic with improvement but not resolution of pain.  Discussed that she should not take NSAIDs for additional 12 hours.  She was given Robaxin up to twice a day.  Discussed that this can be sedating and she should not drive drink alcohol with taking it.  She was placed in a brace for comfort and support.  Discussed that if her DVT study is negative and she continues to have discomfort she should follow-up with sports medicine and was given contact information for local provider with instruction to call to schedule an appointment.  If she develops any worsening symptoms including increased pain, swelling, chest pain, palpitations, shortness of breath she needs to go to the emergency room.  Strict return precautions given.  Work excuse note provided.  Final Clinical Impressions(s) / UC Diagnoses   Final diagnoses:  Left leg pain  Left leg swelling  Acute pain of left knee     Discharge Instructions      Your x-ray was negative.  Please go get your DVT study tomorrow.  We will contact you if this is  abnormal.  We gave an injection of Toradol today.  Please do not take any NSAIDs including aspirin, ibuprofen/Advil, naproxen/Aleve for the next 12 hours.  You can use Tylenol.  I have called in Robaxin.  This will make you sleepy.  Do not drive or drink alcohol with taking it.  If anything worsens you need to be seen immediately including shortness of breath, chest pain, heart racing, increased pain.     ED Prescriptions     Medication Sig Dispense Auth. Provider   methocarbamol (ROBAXIN) 500 MG tablet Take 1 tablet (500 mg total) by mouth 2 (two) times daily. 20 tablet Inez Stantz, Noberto Retort, PA-C      PDMP not reviewed this encounter.   Jeani Hawking, PA-C 09/19/22 2020

## 2022-09-20 ENCOUNTER — Ambulatory Visit (HOSPITAL_COMMUNITY)
Admission: RE | Admit: 2022-09-20 | Discharge: 2022-09-20 | Disposition: A | Discharge status: Home / Self Care | Payer: 59 | Source: Ambulatory Visit | Attending: Physician Assistant | Admitting: Physician Assistant

## 2022-09-20 DIAGNOSIS — M7989 Other specified soft tissue disorders: Secondary | ICD-10-CM | POA: Insufficient documentation

## 2022-09-20 DIAGNOSIS — M79605 Pain in left leg: Secondary | ICD-10-CM | POA: Diagnosis not present

## 2022-09-20 NOTE — Progress Notes (Signed)
Lower extremity venous has been completed.   Preliminary results in CV Proc.   Jinny Blossom Blake Goya 09/20/2022 11:32 AM

## 2022-09-22 ENCOUNTER — Ambulatory Visit (INDEPENDENT_AMBULATORY_CARE_PROVIDER_SITE_OTHER): Payer: 59 | Admitting: Family Medicine

## 2022-09-22 ENCOUNTER — Encounter: Payer: Self-pay | Admitting: Family Medicine

## 2022-09-22 VITALS — BP 127/93 | Ht 64.0 in

## 2022-09-22 DIAGNOSIS — M25562 Pain in left knee: Secondary | ICD-10-CM | POA: Diagnosis not present

## 2022-09-22 MED ORDER — TRAMADOL HCL 50 MG PO TABS: ORAL_TABLET | ORAL | 1 refills | Status: DC

## 2022-09-22 MED ORDER — METHYLPREDNISOLONE ACETATE 40 MG/ML IJ SUSP
40.0000 mg | Freq: Once | INTRAMUSCULAR | Status: AC
  Administered 2022-09-22: 40 mg via INTRA_ARTICULAR

## 2022-09-22 NOTE — Patient Instructions (Signed)
I gave you a corticosteroid shot today in your left knee. Let me see you in 2-3 weeks. Please ice once a day. I called in some tramadol for pain. Please call if you have any worsening of symptoms or new issues. Nice to meet you!

## 2022-09-22 NOTE — Progress Notes (Signed)
  Deanna Munoz - 56 y.o. female MRN [id number]  Date of birth: [birth year]    SUBJECTIVE:      Chief Complaint:/ HPI:  Left knee pain worsening over the last 3 to 4 weeks.  About 2 decades ago she had partial meniscectomy of the left knee for meniscal tear.  Really has not had problems with that since then for more than an occasional day or 2.  3 weeks ago she started having daily problems in the last week she is having difficulty walking at all ;feels like the knee is a little stiffer than usual and she is noticing some full sensation behind the knee. It is a little unstable but no locking or falls. 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  Also stairs increased pain.  Has been using ibuprofen, some old meloxicam she had, and Tylenol without much relief.  Started interfere with her sleep.  Pain is on left 5-7 out of 10.    OBJECTIVE: BP (!) 127/93   Ht 5\' 4"  (1.626 m)   BMI 44.94 kg/m   Physical Exam:  Vital signs are reviewed. GENERAL: Well-developed female no acute distress BMI 44. KNEE: Left.  Full range of motion flexion extension.  Mild tenderness to palpation in the popliteal space and there is slightly full there.  Lateral joint space is mildly tender to palpation.  No significant effusion is noted ligamentously intact to varus and valgus stress.  Review labs: Recent x-rays were reviewed with her which showed mild sclerosis of the medial joint space with also some mild flattening of the tibial plateau consistent with mild to moderate arthritis.  Additionally she had negative lower extremity ultrasound for clot.  PROCEDURE: INJECTION: Patient was given informed consent, signed copy in the chart. Appropriate time out was taken. Area prepped and draped in usual sterile fashion. Ethyl chloride was  used for local anesthesia. A 21 gauge 1 1/2 inch needle was used..  1 cc of methylprednisolone 40 mg/ml plus 4 cc of 1% lidocaine without  epinephrine was injected into the left knee using a(n) anterior medial approach.   The patient tolerated the procedure well. There were no complications. Post procedure instructions were given.   ASSESSMENT & PLAN:  See problem based charting & AVS for pt instructions. Acute pain of left knee Acute knee pain in a patient with history of partial meniscectomy many years ago.  I do think she has done something to aggravate the meniscus.  She does describe some episodes of very brief significant increase in pain so I am concerned that she may have a small meniscal flap although she does not have any specific locking symptoms.  We discussed options.  We will try corticosteroid injection today and follow-up 3 weeks.  If not improving would likely proceed with MRI of the knee as neck step in preparation for arthroscope or other surgical intervention.  Given her high level of pain, we will give her some tramadol the next week or so.  Also write her out of work Monday and Tuesday.  If she needs longer out of work, she will let us know.

## 2022-09-22 NOTE — Assessment & Plan Note (Signed)
Acute knee pain in a patient with history of partial meniscectomy many years ago.  I do think she has done something to aggravate the meniscus.  She does describe some episodes of very brief significant increase in pain so I am concerned that she may have a small meniscal flap although she does not have any specific locking symptoms.  We discussed options.  We will try corticosteroid injection today and follow-up 3 weeks.  If not improving would likely proceed with MRI of the knee as neck step in preparation for arthroscope or other surgical intervention.  Given her high level of pain, we will give her some tramadol the next week or so.  Also write her out of work Monday and Tuesday.  If she needs longer out of work, she will let us know.

## 2022-10-13 ENCOUNTER — Ambulatory Visit (INDEPENDENT_AMBULATORY_CARE_PROVIDER_SITE_OTHER): Payer: 59 | Admitting: Family Medicine

## 2022-10-13 ENCOUNTER — Encounter: Payer: Self-pay | Admitting: Family Medicine

## 2022-10-13 DIAGNOSIS — M25562 Pain in left knee: Secondary | ICD-10-CM | POA: Diagnosis not present

## 2022-10-13 MED ORDER — HYDROCODONE-ACETAMINOPHEN 5-325 MG PO TABS: ORAL_TABLET | ORAL | 0 refills | Status: DC

## 2022-10-13 NOTE — Assessment & Plan Note (Signed)
Left knee pain with history of prior meniscectomy many years ago.  Corticosteroid injection helped for about 2 days.  She also had another instance of acute worsening pain when she stepped up on a curb.  I think she is probably got a meniscal flap.  Needs to have MRI and surgical evaluation.  We will set her up.  We will give her some slightly stronger pain medicine for the next week or so.  She is to use it at night only.

## 2022-10-13 NOTE — Progress Notes (Signed)
  Deanna Munoz - 56 y.o. female MRN [id number]  Date of birth: May 20, 1966    SUBJECTIVE:      Chief Complaint:/ HPI:  Follow-up left knee pain.  Had corticosteroid injection last office visit.  Helped for about 3 days.  Then she stepped up on a curb and felt an acute significant pain since then with her even more.  She is having a lot of trouble getting up and down from a chair.  Having to walk up stairs using the other leg.  Pain is not managed by the tramadol and Tylenol combination.      OBJECTIVE: BP 132/81   Ht 5\' 4"  (1.626 m)   Wt 255 lb (115.7 kg)   BMI 43.77 kg/m   Physical Exam:  Vital signs are reviewed. GENERAL: Well-developed female no acute distress KNEE: Left.  Small amount of synovial bogginess on palpation full range of motion flexion extension.  She does have some pain with last few degrees of full extension.  Ligamentously intact.  No notable effusion  X-rays images reviewed.  X-ray report rhythm is negative.  There is a very slight amount of medial compartment joint space narrowing but this was a long standing view.  There is only 1 oblique.  Otherwise negative. ASSESSMENT & PLAN:  See problem based charting & AVS for pt instructions. Acute pain of left knee Left knee pain with history of prior meniscectomy many years ago.  Corticosteroid injection helped for about 2 days.  She also had another instance of acute worsening pain when she stepped up on a curb.  I think she is probably got a meniscal flap.  Needs to have MRI and surgical evaluation.  We will set her up.  We will give her some slightly stronger pain medicine for the next week or so.  She is to use it at night only.

## 2022-10-13 NOTE — Patient Instructions (Addendum)
I am sorry to hear that your knee is even worse than before.  I do think there may be a small meniscal flap in there that was pinched when he had the most recent incident.  Setting up with orthopedic surgeons for evaluation and treatment.  I suspect they will need MRI but in all likelihood they can get it sooner than I would.  In the interim, I have given you some narcotic pain medication.  You can take up to 6 tabs a day if you need to, do not take tramadol or Tylenol with that.  If you are not taking the narcotic, then you can continue to take the tramadol and Tylenol combination.  The narcotic has some Tylenol with it so I do not want you to get too much Tylenol left the reason for the change.  If you are working during the day, I would only take the narcotic medication in the evening as it can make you quite sleepy and do not drive or operate heavy machinery with it.  Given her issues before you are seen by orthopedics, please give [place] call.  Referral to Dr. [facility] [address], [place] Appt: TODAY 10/13/22 @ 2:30 pm

## 2022-10-16 ENCOUNTER — Telehealth: Payer: Self-pay

## 2022-10-16 NOTE — Telephone Encounter (Signed)
Prior Auth for patients medication HYDOCODONE/ACETAMINOPHEN denied by [employer] via CoverMyMeds.   Reason:   Denial letter scanned to patients media  CoverMyMeds Key: [id number]

## 2022-10-16 NOTE — Telephone Encounter (Signed)
A Prior Authorization was initiated for this patients HYDOCODONE-ACETAMINOPHEN through CoverMyMeds.   Key: [id number]

## 2023-01-31 ENCOUNTER — Other Ambulatory Visit: Payer: Self-pay | Admitting: Family Medicine

## 2023-01-31 MED ORDER — CHLORTHALIDONE 50 MG PO TABS: 50.0000 mg | ORAL_TABLET | Freq: Every day | ORAL | 0 refills | Status: DC

## 2023-01-31 NOTE — Telephone Encounter (Signed)
Requested Prescriptions  Pending Prescriptions Disp Refills   chlorthalidone (HYGROTON) 50 MG tablet 90 tablet 1    Sig: Take 1 tablet (50 mg total) by mouth daily.     Cardiovascular: Diuretics - Thiazide Failed - 01/31/2023  2:48 PM      Failed - Cr in normal range and within 180 days    Creatinine, Ser  Date Value Ref Range Status  03/16/2022 0.93 0.57 - 1.00 mg/dL Final         Failed - K in normal range and within 180 days    Potassium  Date Value Ref Range Status  03/16/2022 3.9 3.5 - 5.2 mmol/L Final         Failed - Na in normal range and within 180 days    Sodium  Date Value Ref Range Status  03/16/2022 135 134 - 144 mmol/L Final         Failed - Valid encounter within last 6 months    Recent Outpatient Visits           10 months ago Annual physical exam   [place] [place] [facility], MD   1 year ago Situational depression   [place] [facility], MD   1 year ago Essential hypertension   [place] [facility] [place]   1 year ago Essential hypertension   [place] [facility], MD   1 year ago Essential hypertension   [place] [facility], MD       Future Appointments             In 3 weeks Dorna Mai, MD [facility] [place] BP in normal range    BP Readings from Last 1 Encounters:  10/13/22 132/81

## 2023-01-31 NOTE — Telephone Encounter (Signed)
Medication Refill - Medication: chlorthalidone (HYGROTON) 50 MG tablet   Has the patient contacted their pharmacy? yes (Agent: If no, request that the patient contact the pharmacy for the refill. If patient does not wish to contact the pharmacy document the reason why and proceed with request.) (Agent: If yes, when and what did the pharmacy advise?)contact pcp  Preferred Pharmacy (with phone number or street name): [facility], [place] [address] phone: [phone] fx [phone] Has the patient been seen for an appointment in the last year OR does the patient have an upcoming appointment? yes  Agent: Please be advised that RX refills may take up to 3 business days. We ask that you follow-up with your pharmacy.

## 2023-02-22 ENCOUNTER — Ambulatory Visit (INDEPENDENT_AMBULATORY_CARE_PROVIDER_SITE_OTHER): Payer: 59 | Admitting: Family Medicine

## 2023-02-22 ENCOUNTER — Encounter: Payer: Self-pay | Admitting: Family Medicine

## 2023-02-22 VITALS — BP 135/90 | HR 102 | Temp 98.1°F | Resp 16 | Wt 267.2 lb

## 2023-02-22 DIAGNOSIS — I10 Essential (primary) hypertension: Secondary | ICD-10-CM | POA: Diagnosis not present

## 2023-02-22 DIAGNOSIS — F341 Dysthymic disorder: Secondary | ICD-10-CM | POA: Diagnosis not present

## 2023-02-22 DIAGNOSIS — Z6841 Body Mass Index (BMI) 40.0 and over, adult: Secondary | ICD-10-CM | POA: Diagnosis not present

## 2023-02-22 DIAGNOSIS — J301 Allergic rhinitis due to pollen: Secondary | ICD-10-CM

## 2023-02-22 DIAGNOSIS — Z7689 Persons encountering health services in other specified circumstances: Secondary | ICD-10-CM

## 2023-02-22 MED ORDER — MIRTAZAPINE 30 MG PO TABS: 30.0000 mg | ORAL_TABLET | Freq: Every day | ORAL | 1 refills | Status: DC

## 2023-02-22 MED ORDER — MONTELUKAST SODIUM 10 MG PO TABS: 10.0000 mg | ORAL_TABLET | Freq: Every day | ORAL | 1 refills | Status: DC

## 2023-02-22 MED ORDER — PHENTERMINE HCL 37.5 MG PO CAPS: 37.5000 mg | ORAL_CAPSULE | ORAL | 0 refills | Status: DC

## 2023-02-22 MED ORDER — METOPROLOL SUCCINATE ER 25 MG PO TB24: 25.0000 mg | ORAL_TABLET | Freq: Every day | ORAL | 0 refills | Status: DC

## 2023-02-22 MED ORDER — CETIRIZINE HCL 10 MG PO TABS: 10.0000 mg | ORAL_TABLET | Freq: Every day | ORAL | 1 refills | Status: DC

## 2023-02-22 MED ORDER — CHLORTHALIDONE 50 MG PO TABS: 50.0000 mg | ORAL_TABLET | Freq: Every day | ORAL | 1 refills | Status: DC

## 2023-02-22 NOTE — Progress Notes (Signed)
Established Patient Office Visit  Subjective    Patient ID: Deanna Munoz, female    DOB: October 14, 1966  Age: 57 y.o. MRN: [id number]  CC: No chief complaint on file.   HPI Deanna Munoz presents for routine follow up of chronic med issues.    Outpatient Encounter Medications as of 02/22/2023  Medication Sig   albuterol (VENTOLIN HFA) 108 (90 Base) MCG/ACT inhaler Inhale 2 puffs into the lungs every 6 (six) hours as needed for wheezing or shortness of breath.   HYDROcodone-acetaminophen (NORCO/VICODIN) 5-325 MG tablet Take one or two tabs by mouth as needed up to a max of 6 tablets a day for acute knee pain   methocarbamol (ROBAXIN) 500 MG tablet Take 1 tablet (500 mg total) by mouth 2 (two) times daily.   metoprolol succinate (TOPROL-XL) 25 MG 24 hr tablet Take 1 tablet (25 mg total) by mouth daily.   phentermine 37.5 MG capsule Take 1 capsule (37.5 mg total) by mouth every morning.   [DISCONTINUED] cetirizine (ZYRTEC) 10 MG tablet Take 10 mg by mouth daily.   [DISCONTINUED] chlorthalidone (HYGROTON) 50 MG tablet Take 1 tablet (50 mg total) by mouth daily.   [DISCONTINUED] mirtazapine (REMERON) 30 MG tablet Take 30 mg by mouth at bedtime.   [DISCONTINUED] montelukast (SINGULAIR) 10 MG tablet Take 1 tablet (10 mg total) by mouth at bedtime.   cetirizine (ZYRTEC) 10 MG tablet Take 1 tablet (10 mg total) by mouth daily.   chlorthalidone (HYGROTON) 50 MG tablet Take 1 tablet (50 mg total) by mouth daily.   mirtazapine (REMERON) 30 MG tablet Take 1 tablet (30 mg total) by mouth at bedtime.   montelukast (SINGULAIR) 10 MG tablet Take 1 tablet (10 mg total) by mouth at bedtime.   No facility-administered encounter medications on file as of 02/22/2023.    Past Medical History:  Diagnosis Date   Hypertension    Obesity     No past surgical history on file.  No family history on file.  Social History   Socioeconomic History   Marital status: Married    Spouse name: Not on file    Number of children: Not on file   Years of education: Not on file   Highest education level: Not on file  Occupational History   Not on file  Tobacco Use   Smoking status: Never   Smokeless tobacco: Never  Substance and Sexual Activity   Alcohol use: No   Drug use: No   Sexual activity: Not on file  Other Topics Concern   Not on file  Social History Narrative   Not on file   Social Determinants of Health   Financial Resource Strain: Not on file  Food Insecurity: Not on file  Transportation Needs: Not on file  Physical Activity: Not on file  Stress: Not on file  Social Connections: Not on file  Intimate Partner Violence: Not on file    Review of Systems  All other systems reviewed and are negative.       Objective    BP (!) 135/90   Pulse (!) 102   Temp 98.1 F (36.7 C) (Oral)   Resp 16   Wt 267 lb 3.2 oz (121.2 kg)   SpO2 95%   BMI 45.86 kg/m   Physical Exam Vitals and nursing note reviewed.  Constitutional:      General: She is not in acute distress.    Appearance: She is obese.  Cardiovascular:     Rate and Rhythm:  Normal rate and regular rhythm.  Pulmonary:     Effort: Pulmonary effort is normal.     Breath sounds: Normal breath sounds.  Neurological:     General: No focal deficit present.     Mental Status: She is alert and oriented to person, place, and time.  Psychiatric:        Mood and Affect: Mood and affect normal.        Behavior: Behavior normal.         Assessment & Plan:   1. Essential hypertension Slightly elevated readings. Will add metoprolol 25 mg to regimen  2. Dysthymia Remeron refilled.   3. Seasonal allergic rhinitis due to pollen Singulair and zyrtec prescribed  4. Encounter for weight management Phentermine prescribed. Dietary and activity options discussed.   5. Class 3 severe obesity due to excess calories with serious comorbidity and body mass index (BMI) of 45.0 to 49.9 in adult [facility]) As above   Return  in about 4 weeks (around 03/22/2023) for follow up.   Becky Sax, MD

## 2023-02-22 NOTE — Progress Notes (Signed)
Patient is here for their 6 month follow-up Patient has no concerns today Care gaps have been discussed with patient  

## 2023-03-30 ENCOUNTER — Ambulatory Visit (INDEPENDENT_AMBULATORY_CARE_PROVIDER_SITE_OTHER): Payer: 59 | Admitting: Family Medicine

## 2023-03-30 VITALS — BP 118/84 | HR 80 | Temp 98.1°F | Resp 16 | Ht 65.0 in | Wt 263.0 lb

## 2023-03-30 DIAGNOSIS — I10 Essential (primary) hypertension: Secondary | ICD-10-CM | POA: Diagnosis not present

## 2023-03-30 DIAGNOSIS — Z7689 Persons encountering health services in other specified circumstances: Secondary | ICD-10-CM | POA: Diagnosis not present

## 2023-03-30 DIAGNOSIS — Z6841 Body Mass Index (BMI) 40.0 and over, adult: Secondary | ICD-10-CM | POA: Diagnosis not present

## 2023-03-30 DIAGNOSIS — F341 Dysthymic disorder: Secondary | ICD-10-CM

## 2023-03-30 DIAGNOSIS — Z23 Encounter for immunization: Secondary | ICD-10-CM

## 2023-03-30 MED ORDER — PHENTERMINE HCL 37.5 MG PO CAPS: 37.5000 mg | ORAL_CAPSULE | ORAL | 0 refills | Status: DC

## 2023-03-30 MED ORDER — MONTELUKAST SODIUM 10 MG PO TABS: 10.0000 mg | ORAL_TABLET | Freq: Every day | ORAL | 1 refills | Status: DC

## 2023-03-30 MED ORDER — MIRTAZAPINE 30 MG PO TABS: 30.0000 mg | ORAL_TABLET | Freq: Every day | ORAL | 1 refills | Status: DC

## 2023-03-30 MED ORDER — CHLORTHALIDONE 50 MG PO TABS: 50.0000 mg | ORAL_TABLET | Freq: Every day | ORAL | 1 refills | Status: DC

## 2023-03-30 MED ORDER — CETIRIZINE HCL 10 MG PO TABS: 10.0000 mg | ORAL_TABLET | Freq: Every day | ORAL | 1 refills | Status: DC

## 2023-03-30 MED ORDER — METOPROLOL SUCCINATE ER 25 MG PO TB24: 25.0000 mg | ORAL_TABLET | Freq: Every day | ORAL | 0 refills | Status: DC

## 2023-03-30 NOTE — Progress Notes (Unsigned)
Patient is here complete physical examination Patient has no concerns today Care gaps have been discussed with patient

## 2023-04-04 ENCOUNTER — Encounter: Payer: Self-pay | Admitting: Family Medicine

## 2023-04-04 NOTE — Progress Notes (Addendum)
Established Patient Office Visit  Subjective    Patient ID: Deanna Munoz, female    DOB: December 28, 1965  Age: 57 y.o. MRN: [id number]  CC:  Chief Complaint  Patient presents with   Annual Exam    HPI Macil Crady presents for follow up of chronic med issues.    Outpatient Encounter Medications as of 03/30/2023  Medication Sig   albuterol (VENTOLIN HFA) 108 (90 Base) MCG/ACT inhaler Inhale 2 puffs into the lungs every 6 (six) hours as needed for wheezing or shortness of breath.   HYDROcodone-acetaminophen (NORCO/VICODIN) 5-325 MG tablet Take one or two tabs by mouth as needed up to a max of 6 tablets a day for acute knee pain   methocarbamol (ROBAXIN) 500 MG tablet Take 1 tablet (500 mg total) by mouth 2 (two) times daily.   phentermine 37.5 MG capsule Take 1 capsule (37.5 mg total) by mouth every morning.   phentermine 37.5 MG capsule Take 1 capsule (37.5 mg total) by mouth every morning.   [DISCONTINUED] cetirizine (ZYRTEC) 10 MG tablet Take 1 tablet (10 mg total) by mouth daily.   [DISCONTINUED] chlorthalidone (HYGROTON) 50 MG tablet Take 1 tablet (50 mg total) by mouth daily.   [DISCONTINUED] metoprolol succinate (TOPROL-XL) 25 MG 24 hr tablet Take 1 tablet (25 mg total) by mouth daily.   [DISCONTINUED] mirtazapine (REMERON) 30 MG tablet Take 1 tablet (30 mg total) by mouth at bedtime.   [DISCONTINUED] montelukast (SINGULAIR) 10 MG tablet Take 1 tablet (10 mg total) by mouth at bedtime.   cetirizine (ZYRTEC) 10 MG tablet Take 1 tablet (10 mg total) by mouth daily.   chlorthalidone (HYGROTON) 50 MG tablet Take 1 tablet (50 mg total) by mouth daily.   metoprolol succinate (TOPROL-XL) 25 MG 24 hr tablet Take 1 tablet (25 mg total) by mouth daily.   mirtazapine (REMERON) 30 MG tablet Take 1 tablet (30 mg total) by mouth at bedtime.   montelukast (SINGULAIR) 10 MG tablet Take 1 tablet (10 mg total) by mouth at bedtime.   No facility-administered encounter medications on file as of  03/30/2023.    Past Medical History:  Diagnosis Date   Hypertension    Obesity     No past surgical history on file.  No family history on file.  Social History   Socioeconomic History   Marital status: Married    Spouse name: Not on file   Number of children: Not on file   Years of education: Not on file   Highest education level: Not on file  Occupational History   Not on file  Tobacco Use   Smoking status: Never   Smokeless tobacco: Never  Substance and Sexual Activity   Alcohol use: No   Drug use: No   Sexual activity: Not on file  Other Topics Concern   Not on file  Social History Narrative   Not on file   Social Determinants of Health   Financial Resource Strain: Low Risk  (03/30/2023)   Overall Financial Resource Strain (CARDIA)    Difficulty of Paying Living Expenses: Not very hard  Food Insecurity: No Food Insecurity (03/30/2023)   Hunger Vital Sign    Worried About Running Out of Food in the Last Year: Never true    Ran Out of Food in the Last Year: Never true  Transportation Needs: No Transportation Needs (03/30/2023)   PRAPARE - Administrator, Civil Service (Medical): No    Lack of Transportation (Non-Medical): No  Physical  Activity: Insufficiently Active (03/30/2023)   Exercise Vital Sign    Days of Exercise per Week: 3 days    Minutes of Exercise per Session: 30 min  Stress: No Stress Concern Present (03/30/2023)   [name] of Occupational Health - Occupational Stress Questionnaire    Feeling of Stress : Not at all  Social Connections: Socially Integrated (03/30/2023)   Social Connection and Isolation Panel [NHANES]    Frequency of Communication with Friends and Family: More than three times a week    Frequency of Social Gatherings with Friends and Family: More than three times a week    Attends Religious Services: More than 4 times per year    Active Member of [employer] or Organizations: Patient unable to answer    Attends Tax inspector Meetings: 1 to 4 times per year    Marital Status: Married  Catering manager Violence: Not At Risk (03/30/2023)   Humiliation, Afraid, Rape, and Kick questionnaire    Fear of Current or Ex-Partner: No    Emotionally Abused: No    Physically Abused: No    Sexually Abused: No    Review of Systems  All other systems reviewed and are negative.       Objective    BP 118/84   Pulse 80   Temp 98.1 F (36.7 C) (Oral)   Resp 16   Ht  (1.651 m)   Wt 263 lb (119.3 kg)   SpO2 94%   BMI 43.77 kg/m   Physical Exam Vitals and nursing note reviewed.  Constitutional:      General: She is not in acute distress.    Appearance: She is obese.  Cardiovascular:     Rate and Rhythm: Normal rate and regular rhythm.  Pulmonary:     Effort: Pulmonary effort is normal.     Breath sounds: Normal breath sounds.  Neurological:     General: No focal deficit present.     Mental Status: She is alert and oriented to person, place, and time.  Psychiatric:        Mood and Affect: Mood and affect normal.        Behavior: Behavior normal.         Assessment & Plan:   1. Essential hypertension Appears stable. Continue. Meds refilled.   2. Dysthymia Appears stable. Meds refilled.   3. Encounter for weight management Phentermine prescribed. Goal is 4-6lbs/mo wt loss  4. Class 3 severe obesity due to excess calories with serious comorbidity and body mass index (BMI) of 45.0 to 49.9 in adult As above  5. Need for shingles vaccine  - Varicella-zoster vaccine IM    Return in about 4 weeks (around 04/27/2023) for follow up, physical.   [name], MD

## 2023-04-05 ENCOUNTER — Ambulatory Visit: Payer: 59 | Admitting: Family Medicine

## 2023-04-16 ENCOUNTER — Other Ambulatory Visit: Payer: Self-pay | Admitting: Family Medicine

## 2023-04-16 DIAGNOSIS — Z1231 Encounter for screening mammogram for malignant neoplasm of breast: Secondary | ICD-10-CM

## 2023-05-10 ENCOUNTER — Encounter: Payer: Self-pay | Admitting: Family Medicine

## 2023-05-10 ENCOUNTER — Ambulatory Visit (INDEPENDENT_AMBULATORY_CARE_PROVIDER_SITE_OTHER): Payer: 59 | Admitting: Family Medicine

## 2023-05-10 ENCOUNTER — Other Ambulatory Visit: Payer: Self-pay | Admitting: Family Medicine

## 2023-05-10 VITALS — BP 132/87 | HR 88 | Temp 98.1°F | Resp 16 | Ht 64.0 in | Wt 259.4 lb

## 2023-05-10 DIAGNOSIS — Z13228 Encounter for screening for other metabolic disorders: Secondary | ICD-10-CM

## 2023-05-10 DIAGNOSIS — Z13220 Encounter for screening for lipoid disorders: Secondary | ICD-10-CM | POA: Diagnosis not present

## 2023-05-10 DIAGNOSIS — Z0000 Encounter for general adult medical examination without abnormal findings: Secondary | ICD-10-CM | POA: Diagnosis not present

## 2023-05-10 DIAGNOSIS — Z1329 Encounter for screening for other suspected endocrine disorder: Secondary | ICD-10-CM | POA: Diagnosis not present

## 2023-05-10 DIAGNOSIS — Z130 Encounter for screening for diseases of the blood and blood-forming organs and certain disorders involving the immune mechanism: Secondary | ICD-10-CM | POA: Diagnosis not present

## 2023-05-10 NOTE — Progress Notes (Signed)
-  Patient is here to have annually  complete physical examination  -Care gap address -labs taken  

## 2023-05-11 LAB — CMP14+EGFR
ALT: 12 IU/L (ref 0–32)
AST: 15 IU/L (ref 0–40)
Albumin/Globulin Ratio: 1.2 (ref 1.2–2.2)
Albumin: 4.1 g/dL (ref 3.8–4.9)
Alkaline Phosphatase: 111 IU/L (ref 44–121)
BUN/Creatinine Ratio: 8 — ABNORMAL LOW (ref 9–23)
BUN: 8 mg/dL (ref 6–24)
Bilirubin Total: 0.3 mg/dL (ref 0.0–1.2)
CO2: 24 mmol/L (ref 20–29)
Calcium: 9.7 mg/dL (ref 8.7–10.2)
Chloride: 90 mmol/L — ABNORMAL LOW (ref 96–106)
Creatinine, Ser: 1.05 mg/dL — ABNORMAL HIGH (ref 0.57–1.00)
Globulin, Total: 3.4 g/dL (ref 1.5–4.5)
Glucose: 81 mg/dL (ref 70–99)
Potassium: 3.6 mmol/L (ref 3.5–5.2)
Sodium: 130 mmol/L — ABNORMAL LOW (ref 134–144)
Total Protein: 7.5 g/dL (ref 6.0–8.5)
eGFR: 62 mL/min/{1.73_m2} (ref >59)

## 2023-05-11 LAB — VITAMIN D 25 HYDROXY (VIT D DEFICIENCY, FRACTURES): Vit D, 25-Hydroxy: 60.4 ng/mL (ref 30.0–100.0)

## 2023-05-11 LAB — CBC WITH DIFFERENTIAL/PLATELET
Basophils Absolute: 0 10*3/uL (ref 0.0–0.2)
Basos: 0 %
EOS (ABSOLUTE): 0.1 10*3/uL (ref 0.0–0.4)
Eos: 2 %
Hematocrit: 38.3 % (ref 34.0–46.6)
Hemoglobin: 12.5 g/dL (ref 11.1–15.9)
Immature Grans (Abs): 0 10*3/uL (ref 0.0–0.1)
Immature Granulocytes: 0 %
Lymphocytes Absolute: 2.1 10*3/uL (ref 0.7–3.1)
Lymphs: 28 %
MCH: 27.1 pg (ref 26.6–33.0)
MCHC: 32.6 g/dL (ref 31.5–35.7)
MCV: 83 fL (ref 79–97)
Monocytes Absolute: 0.6 10*3/uL (ref 0.1–0.9)
Monocytes: 8 %
Neutrophils Absolute: 4.5 10*3/uL (ref 1.4–7.0)
Neutrophils: 62 %
Platelets: 329 10*3/uL (ref 150–450)
RBC: 4.61 x10E6/uL (ref 3.77–5.28)
RDW: 13.7 % (ref 11.7–15.4)
WBC: 7.3 10*3/uL (ref 3.4–10.8)

## 2023-05-11 LAB — LIPID PANEL
Chol/HDL Ratio: 4 ratio (ref 0.0–4.4)
Cholesterol, Total: 184 mg/dL (ref 100–199)
HDL: 46 mg/dL (ref >39)
LDL Chol Calc (NIH): 119 mg/dL — ABNORMAL HIGH (ref 0–99)
Triglycerides: 105 mg/dL (ref 0–149)
VLDL Cholesterol Cal: 19 mg/dL (ref 5–40)

## 2023-05-11 LAB — TSH: TSH: 2.03 u[IU]/mL (ref 0.450–4.500)

## 2023-05-11 LAB — HEMOGLOBIN A1C
Est. average glucose Bld gHb Est-mCnc: 123 mg/dL
Hgb A1c MFr Bld: 5.9 % — ABNORMAL HIGH (ref 4.8–5.6)

## 2023-05-14 ENCOUNTER — Other Ambulatory Visit: Payer: Self-pay | Admitting: Family Medicine

## 2023-05-15 ENCOUNTER — Other Ambulatory Visit: Payer: Self-pay | Admitting: *Deleted

## 2023-05-15 ENCOUNTER — Encounter: Payer: Self-pay | Admitting: Family Medicine

## 2023-05-15 DIAGNOSIS — Z130 Encounter for screening for diseases of the blood and blood-forming organs and certain disorders involving the immune mechanism: Secondary | ICD-10-CM

## 2023-05-15 MED ORDER — METOPROLOL SUCCINATE ER 25 MG PO TB24: 25.0000 mg | ORAL_TABLET | Freq: Every day | ORAL | 0 refills | Status: DC

## 2023-05-15 NOTE — Progress Notes (Signed)
Established Patient Office Visit  Subjective    Patient ID: Deanna Munoz, female    DOB: 09/30/1966  Age: 57 y.o. MRN: [id number]  CC:  Chief Complaint  Patient presents with   Annual Exam    HPI Deanna Munoz presents for routine annual exam. Patient denies acute complaints or concerns.    Outpatient Encounter Medications as of 05/10/2023  Medication Sig   albuterol (VENTOLIN HFA) 108 (90 Base) MCG/ACT inhaler Inhale 2 puffs into the lungs every 6 (six) hours as needed for wheezing or shortness of breath.   cetirizine (ZYRTEC) 10 MG tablet Take 1 tablet (10 mg total) by mouth daily.   chlorthalidone (HYGROTON) 50 MG tablet Take 1 tablet (50 mg total) by mouth daily.   HYDROcodone-acetaminophen (NORCO/VICODIN) 5-325 MG tablet Take one or two tabs by mouth as needed up to a max of 6 tablets a day for acute knee pain   methocarbamol (ROBAXIN) 500 MG tablet Take 1 tablet (500 mg total) by mouth 2 (two) times daily.   mirtazapine (REMERON) 30 MG tablet Take 1 tablet (30 mg total) by mouth at bedtime.   montelukast (SINGULAIR) 10 MG tablet Take 1 tablet (10 mg total) by mouth at bedtime.   phentermine 37.5 MG capsule Take 1 capsule (37.5 mg total) by mouth every morning.   phentermine 37.5 MG capsule Take 1 capsule (37.5 mg total) by mouth every morning.   [DISCONTINUED] metoprolol succinate (TOPROL-XL) 25 MG 24 hr tablet Take 1 tablet (25 mg total) by mouth daily.   No facility-administered encounter medications on file as of 05/10/2023.    Past Medical History:  Diagnosis Date   Hypertension    Obesity     No past surgical history on file.  No family history on file.  Social History   Socioeconomic History   Marital status: Married    Spouse name: Not on file   Number of children: Not on file   Years of education: Not on file   Highest education level: Not on file  Occupational History   Not on file  Tobacco Use   Smoking status: Never   Smokeless tobacco:  Never  Substance and Sexual Activity   Alcohol use: No   Drug use: No   Sexual activity: Not on file  Other Topics Concern   Not on file  Social History Narrative   Not on file   Social Determinants of Health   Financial Resource Strain: Low Risk  (03/30/2023)   Overall Financial Resource Strain (CARDIA)    Difficulty of Paying Living Expenses: Not very hard  Food Insecurity: No Food Insecurity (03/30/2023)   Hunger Vital Sign    Worried About Running Out of Food in the Last Year: Never true    Ran Out of Food in the Last Year: Never true  Transportation Needs: No Transportation Needs (03/30/2023)   PRAPARE - Administrator, Civil Service (Medical): No    Lack of Transportation (Non-Medical): No  Physical Activity: Insufficiently Active (03/30/2023)   Exercise Vital Sign    Days of Exercise per Week: 3 days    Minutes of Exercise per Session: 30 min  Stress: No Stress Concern Present (03/30/2023)   [name] of Occupational Health - Occupational Stress Questionnaire    Feeling of Stress : Not at all  Social Connections: Socially Integrated (03/30/2023)   Social Connection and Isolation Panel [NHANES]    Frequency of Communication with Friends and Family: More than three times a  week    Frequency of Social Gatherings with Friends and Family: More than three times a week    Attends Religious Services: More than 4 times per year    Active Member of Clubs or Organizations: Patient unable to answer    Attends Banker Meetings: 1 to 4 times per year    Marital Status: Married  Catering manager Violence: Not At Risk (03/30/2023)   Humiliation, Afraid, Rape, and Kick questionnaire    Fear of Current or Ex-Partner: No    Emotionally Abused: No    Physically Abused: No    Sexually Abused: No    Review of Systems  All other systems reviewed and are negative.       Objective    BP 132/87   Pulse 88   Temp 98.1 F (36.7 C) (Oral)   Resp 16   Ht  5\' 4"  (1.626 m)   Wt 259 lb 6.4 oz (117.7 kg)   BMI 44.53 kg/m   Physical Exam Vitals and nursing note reviewed.  Constitutional:      General: She is not in acute distress.    Appearance: She is obese.  HENT:     Head: Normocephalic and atraumatic.     Right Ear: Tympanic membrane, ear canal and external ear normal.     Left Ear: Tympanic membrane, ear canal and external ear normal.     Nose: Nose normal.     Mouth/Throat:     Mouth: Mucous membranes are moist.     Pharynx: Oropharynx is clear.  Eyes:     Conjunctiva/sclera: Conjunctivae normal.     Pupils: Pupils are equal, round, and reactive to light.  Neck:     Thyroid: No thyromegaly.  Cardiovascular:     Rate and Rhythm: Normal rate and regular rhythm.     Heart sounds: Normal heart sounds. No murmur heard. Pulmonary:     Effort: Pulmonary effort is normal. No respiratory distress.     Breath sounds: Normal breath sounds.  Abdominal:     General: There is no distension.     Palpations: Abdomen is soft. There is no mass.     Tenderness: There is no abdominal tenderness.  Musculoskeletal:        General: Normal range of motion.     Cervical back: Normal range of motion and neck supple.  Skin:    General: Skin is warm and dry.  Neurological:     General: No focal deficit present.     Mental Status: She is alert and oriented to person, place, and time.  Psychiatric:        Mood and Affect: Mood normal.        Behavior: Behavior normal.         Assessment & Plan:   1. Annual physical exam  - CMP14+EGFR  2. Screening for deficiency anemia  - CBC with Differential  3. Screening for lipid disorders  - Lipid Panel  4. Screening for endocrine/metabolic/immunity disorders  - Vitamin D, 25-hydroxy - Hemoglobin A1c - TSH    No follow-ups on file.   [name], MD

## 2023-05-18 MED ORDER — PHENTERMINE HCL 37.5 MG PO CAPS: 37.5000 mg | ORAL_CAPSULE | ORAL | 0 refills | Status: DC

## 2023-05-22 ENCOUNTER — Ambulatory Visit: Payer: 59

## 2023-06-06 ENCOUNTER — Encounter: Payer: Self-pay | Admitting: Family Medicine

## 2023-06-06 ENCOUNTER — Ambulatory Visit: Payer: 59 | Admitting: Family Medicine

## 2023-06-06 ENCOUNTER — Other Ambulatory Visit: Payer: Self-pay | Admitting: Family Medicine

## 2023-06-06 VITALS — BP 128/86 | HR 84 | Temp 98.1°F | Resp 16 | Wt 258.0 lb

## 2023-06-06 DIAGNOSIS — E871 Hypo-osmolality and hyponatremia: Secondary | ICD-10-CM

## 2023-06-06 DIAGNOSIS — Z7689 Persons encountering health services in other specified circumstances: Secondary | ICD-10-CM | POA: Diagnosis not present

## 2023-06-06 DIAGNOSIS — Z6841 Body Mass Index (BMI) 40.0 and over, adult: Secondary | ICD-10-CM

## 2023-06-06 MED ORDER — METOPROLOL SUCCINATE ER 25 MG PO TB24: 25.0000 mg | ORAL_TABLET | Freq: Every day | ORAL | 0 refills | Status: DC

## 2023-06-06 MED ORDER — PHENTERMINE HCL 37.5 MG PO CAPS: 37.5000 mg | ORAL_CAPSULE | ORAL | 0 refills | Status: DC

## 2023-06-06 NOTE — Progress Notes (Signed)
we

## 2023-06-07 LAB — BASIC METABOLIC PANEL
BUN/Creatinine Ratio: 8 — ABNORMAL LOW (ref 9–23)
BUN: 8 mg/dL (ref 6–24)
CO2: 28 mmol/L (ref 20–29)
Calcium: 10.4 mg/dL — ABNORMAL HIGH (ref 8.7–10.2)
Chloride: 94 mmol/L — ABNORMAL LOW (ref 96–106)
Creatinine, Ser: 1 mg/dL (ref 0.57–1.00)
Glucose: 93 mg/dL (ref 70–99)
Potassium: 4.2 mmol/L (ref 3.5–5.2)
Sodium: 132 mmol/L — ABNORMAL LOW (ref 134–144)
eGFR: 66 mL/min/{1.73_m2} (ref >59)

## 2023-06-08 ENCOUNTER — Encounter: Payer: Self-pay | Admitting: Family Medicine

## 2023-06-08 NOTE — Progress Notes (Signed)
Established Patient Office Visit  Subjective    Patient ID: Deanna Munoz, female    DOB: 10-May-1966  Age: 57 y.o. MRN: [id number]  CC: No chief complaint on file.   HPI Deanna Munoz presents for routine weight management. Patient denies acute complaints.    Outpatient Encounter Medications as of 06/06/2023  Medication Sig   albuterol (VENTOLIN HFA) 108 (90 Base) MCG/ACT inhaler Inhale 2 puffs into the lungs every 6 (six) hours as needed for wheezing or shortness of breath.   cetirizine (ZYRTEC) 10 MG tablet Take 1 tablet (10 mg total) by mouth daily.   chlorthalidone (HYGROTON) 50 MG tablet Take 1 tablet (50 mg total) by mouth daily.   HYDROcodone-acetaminophen (NORCO/VICODIN) 5-325 MG tablet Take one or two tabs by mouth as needed up to a max of 6 tablets a day for acute knee pain   methocarbamol (ROBAXIN) 500 MG tablet Take 1 tablet (500 mg total) by mouth 2 (two) times daily.   mirtazapine (REMERON) 30 MG tablet Take 1 tablet (30 mg total) by mouth at bedtime.   montelukast (SINGULAIR) 10 MG tablet Take 1 tablet (10 mg total) by mouth at bedtime.   phentermine 37.5 MG capsule Take 1 capsule (37.5 mg total) by mouth every morning.   [DISCONTINUED] metoprolol succinate (TOPROL-XL) 25 MG 24 hr tablet Take 1 tablet (25 mg total) by mouth daily.   [DISCONTINUED] phentermine 37.5 MG capsule Take 1 capsule (37.5 mg total) by mouth every morning.   phentermine 37.5 MG capsule Take 1 capsule (37.5 mg total) by mouth every morning.   No facility-administered encounter medications on file as of 06/06/2023.    Past Medical History:  Diagnosis Date   Hypertension    Obesity     No past surgical history on file.  No family history on file.  Social History   Socioeconomic History   Marital status: Married    Spouse name: Not on file   Number of children: Not on file   Years of education: Not on file   Highest education level: Not on file  Occupational History   Not on file   Tobacco Use   Smoking status: Never   Smokeless tobacco: Never  Substance and Sexual Activity   Alcohol use: No   Drug use: No   Sexual activity: Not on file  Other Topics Concern   Not on file  Social History Narrative   Not on file   Social Determinants of Health   Financial Resource Strain: Low Risk  (03/30/2023)   Overall Financial Resource Strain (CARDIA)    Difficulty of Paying Living Expenses: Not very hard  Food Insecurity: No Food Insecurity (03/30/2023)   Hunger Vital Sign    Worried About Running Out of Food in the Last Year: Never true    Ran Out of Food in the Last Year: Never true  Transportation Needs: No Transportation Needs (03/30/2023)   PRAPARE - Administrator, Civil Service (Medical): No    Lack of Transportation (Non-Medical): No  Physical Activity: Insufficiently Active (03/30/2023)   Exercise Vital Sign    Days of Exercise per Week: 3 days    Minutes of Exercise per Session: 30 min  Stress: No Stress Concern Present (03/30/2023)   [name] of Occupational Health - Occupational Stress Questionnaire    Feeling of Stress : Not at all  Social Connections: Socially Integrated (03/30/2023)   Social Connection and Isolation Panel [NHANES]    Frequency of Communication with  Friends and Family: More than three times a week    Frequency of Social Gatherings with Friends and Family: More than three times a week    Attends Religious Services: More than 4 times per year    Active Member of [employer] or Organizations: Patient unable to answer    Attends Banker Meetings: 1 to 4 times per year    Marital Status: Married  Catering manager Violence: Not At Risk (03/30/2023)   Humiliation, Afraid, Rape, and Kick questionnaire    Fear of Current or Ex-Partner: No    Emotionally Abused: No    Physically Abused: No    Sexually Abused: No    Review of Systems  All other systems reviewed and are negative.       Objective    BP 128/86    Pulse 84   Temp 98.1 F (36.7 C) (Oral)   Resp 16   Wt 258 lb (117 kg)   SpO2 96%   BMI 44.29 kg/m   Physical Exam Vitals and nursing note reviewed.  Constitutional:      General: She is not in acute distress.    Appearance: She is obese.  Cardiovascular:     Rate and Rhythm: Normal rate and regular rhythm.  Pulmonary:     Effort: Pulmonary effort is normal.     Breath sounds: Normal breath sounds.  Neurological:     General: No focal deficit present.     Mental Status: She is alert and oriented to person, place, and time.  Psychiatric:        Mood and Affect: Mood and affect normal.        Behavior: Behavior normal.         Assessment & Plan:   1. Encounter for weight management Discussed dietary and activity options. Goal is 3-5lbs/mo wt loss. Phentermine prescribed  2. Class 3 severe obesity due to excess calories with serious comorbidity and body mass index (BMI) of 45.0 to 49.9 in adult (HCC)   3. Hyponatremia Monitoring lab ordered - Basic Metabolic Panel    Return in about 4 weeks (around 07/04/2023) for follow up.   Deanna Raymond, MD

## 2023-06-25 ENCOUNTER — Ambulatory Visit
Admission: RE | Admit: 2023-06-25 | Discharge: 2023-06-25 | Disposition: A | Discharge status: Home / Self Care | Payer: 59 | Source: Ambulatory Visit | Attending: Family Medicine | Admitting: Family Medicine

## 2023-06-25 DIAGNOSIS — Z1231 Encounter for screening mammogram for malignant neoplasm of breast: Secondary | ICD-10-CM

## 2023-07-09 ENCOUNTER — Ambulatory Visit: Payer: 59 | Admitting: Family Medicine

## 2023-08-01 ENCOUNTER — Other Ambulatory Visit: Payer: Self-pay | Admitting: Family Medicine

## 2023-08-21 ENCOUNTER — Encounter: Payer: Self-pay | Admitting: Family Medicine

## 2023-08-21 ENCOUNTER — Ambulatory Visit: Payer: 59 | Admitting: Family Medicine

## 2023-08-21 VITALS — BP 122/87 | HR 76 | Temp 98.0°F | Resp 16 | Ht 65.0 in | Wt 247.0 lb

## 2023-08-21 DIAGNOSIS — Z130 Encounter for screening for diseases of the blood and blood-forming organs and certain disorders involving the immune mechanism: Secondary | ICD-10-CM | POA: Diagnosis not present

## 2023-08-21 DIAGNOSIS — Z1329 Encounter for screening for other suspected endocrine disorder: Secondary | ICD-10-CM | POA: Diagnosis not present

## 2023-08-21 DIAGNOSIS — I10 Essential (primary) hypertension: Secondary | ICD-10-CM

## 2023-08-21 DIAGNOSIS — Z6841 Body Mass Index (BMI) 40.0 and over, adult: Secondary | ICD-10-CM | POA: Diagnosis not present

## 2023-08-21 DIAGNOSIS — Z7689 Persons encountering health services in other specified circumstances: Secondary | ICD-10-CM

## 2023-08-21 DIAGNOSIS — Z13228 Encounter for screening for other metabolic disorders: Secondary | ICD-10-CM

## 2023-08-21 DIAGNOSIS — E871 Hypo-osmolality and hyponatremia: Secondary | ICD-10-CM

## 2023-08-21 DIAGNOSIS — Z23 Encounter for immunization: Secondary | ICD-10-CM | POA: Diagnosis not present

## 2023-08-21 MED ORDER — CHLORTHALIDONE 50 MG PO TABS: 50.0000 mg | ORAL_TABLET | Freq: Every day | ORAL | 1 refills | Status: DC

## 2023-08-21 MED ORDER — MIRTAZAPINE 30 MG PO TABS: 30.0000 mg | ORAL_TABLET | Freq: Every day | ORAL | 1 refills | Status: AC

## 2023-08-21 MED ORDER — MONTELUKAST SODIUM 10 MG PO TABS: 10.0000 mg | ORAL_TABLET | Freq: Every day | ORAL | 1 refills | Status: DC

## 2023-08-21 MED ORDER — METOPROLOL SUCCINATE ER 25 MG PO TB24: 25.0000 mg | ORAL_TABLET | Freq: Every day | ORAL | 1 refills | Status: DC

## 2023-08-21 MED ORDER — PHENTERMINE HCL 37.5 MG PO CAPS: 37.5000 mg | ORAL_CAPSULE | ORAL | 0 refills | Status: DC

## 2023-08-21 MED ORDER — CETIRIZINE HCL 10 MG PO TABS: 10.0000 mg | ORAL_TABLET | Freq: Every day | ORAL | 1 refills | Status: DC

## 2023-08-21 NOTE — Progress Notes (Signed)
Established Patient Office Visit  Subjective    Patient ID: Deanna Munoz, female    DOB: 23-Jun-1966  Age: 57 y.o. MRN: [id number]  CC:  Chief Complaint  Patient presents with   Follow-up     b/p medication refilled    HPI Deanna Munoz presents for follow up of chronic med issues.    Outpatient Encounter Medications as of 08/21/2023  Medication Sig   albuterol (VENTOLIN HFA) 108 (90 Base) MCG/ACT inhaler Inhale 2 puffs into the lungs every 6 (six) hours as needed for wheezing or shortness of breath.   HYDROcodone-acetaminophen (NORCO/VICODIN) 5-325 MG tablet Take one or two tabs by mouth as needed up to a max of 6 tablets a day for acute knee pain   methocarbamol (ROBAXIN) 500 MG tablet Take 1 tablet (500 mg total) by mouth 2 (two) times daily.   [DISCONTINUED] cetirizine (ZYRTEC) 10 MG tablet Take 1 tablet (10 mg total) by mouth daily.   [DISCONTINUED] chlorthalidone (HYGROTON) 50 MG tablet Take 1 tablet (50 mg total) by mouth daily.   [DISCONTINUED] metoprolol succinate (TOPROL-XL) 25 MG 24 hr tablet TAKE 1 TABLET (25 MG TOTAL) BY MOUTH DAILY.   [DISCONTINUED] mirtazapine (REMERON) 30 MG tablet Take 1 tablet (30 mg total) by mouth at bedtime.   [DISCONTINUED] montelukast (SINGULAIR) 10 MG tablet Take 1 tablet (10 mg total) by mouth at bedtime.   [DISCONTINUED] phentermine 37.5 MG capsule Take 1 capsule (37.5 mg total) by mouth every morning.   [DISCONTINUED] phentermine 37.5 MG capsule Take 1 capsule (37.5 mg total) by mouth every morning.   cetirizine (ZYRTEC) 10 MG tablet Take 1 tablet (10 mg total) by mouth daily.   chlorthalidone (HYGROTON) 50 MG tablet Take 1 tablet (50 mg total) by mouth daily.   metoprolol succinate (TOPROL-XL) 25 MG 24 hr tablet Take 1 tablet (25 mg total) by mouth daily.   mirtazapine (REMERON) 30 MG tablet Take 1 tablet (30 mg total) by mouth at bedtime.   montelukast (SINGULAIR) 10 MG tablet Take 1 tablet (10 mg total) by mouth at bedtime.    phentermine 37.5 MG capsule Take 1 capsule (37.5 mg total) by mouth every morning.   No facility-administered encounter medications on file as of 08/21/2023.    Past Medical History:  Diagnosis Date   Hypertension    Obesity     History reviewed. No pertinent surgical history.  History reviewed. No pertinent family history.  Social History   Socioeconomic History   Marital status: Married    Spouse name: Not on file   Number of children: Not on file   Years of education: Not on file   Highest education level: Not on file  Occupational History   Not on file  Tobacco Use   Smoking status: Never   Smokeless tobacco: Never  Substance and Sexual Activity   Alcohol use: No   Drug use: No   Sexual activity: Not on file  Other Topics Concern   Not on file  Social History Narrative   Not on file   Social Determinants of Health   Financial Resource Strain: Low Risk  (03/30/2023)   Overall Financial Resource Strain (CARDIA)    Difficulty of Paying Living Expenses: Not very hard  Food Insecurity: No Food Insecurity (03/30/2023)   Hunger Vital Sign    Worried About Running Out of Food in the Last Year: Never true    Ran Out of Food in the Last Year: Never true  Transportation Needs: No Transportation  Needs (03/30/2023)   PRAPARE - Administrator, Civil Service (Medical): No    Lack of Transportation (Non-Medical): No  Physical Activity: Insufficiently Active (03/30/2023)   Exercise Vital Sign    Days of Exercise per Week: 3 days    Minutes of Exercise per Session: 30 min  Stress: No Stress Concern Present (03/30/2023)   [name] of Occupational Health - Occupational Stress Questionnaire    Feeling of Stress : Not at all  Social Connections: Socially Integrated (03/30/2023)   Social Connection and Isolation Panel [NHANES]    Frequency of Communication with Friends and Family: More than three times a week    Frequency of Social Gatherings with Friends and  Family: More than three times a week    Attends Religious Services: More than 4 times per year    Active Member of [employer] or Organizations: Patient unable to answer    Attends Banker Meetings: 1 to 4 times per year    Marital Status: Married  Catering manager Violence: Not At Risk (03/30/2023)   Humiliation, Afraid, Rape, and Kick questionnaire    Fear of Current or Ex-Partner: No    Emotionally Abused: No    Physically Abused: No    Sexually Abused: No    Review of Systems  All other systems reviewed and are negative.       Objective    BP 122/87 (BP Location: Right Arm, Patient Position: Sitting, Cuff Size: Large)   Pulse 76   Temp 98 F (36.7 C) (Oral)   Resp 16   Ht 5\' 5"  (1.651 m)   Wt 247 lb (112 kg)   SpO2 95%   BMI 41.10 kg/m   Physical Exam Vitals and nursing note reviewed.  Constitutional:      General: She is not in acute distress.    Appearance: She is obese.  Cardiovascular:     Rate and Rhythm: Normal rate and regular rhythm.  Pulmonary:     Effort: Pulmonary effort is normal.     Breath sounds: Normal breath sounds.  Neurological:     General: No focal deficit present.     Mental Status: She is alert and oriented to person, place, and time.  Psychiatric:        Mood and Affect: Mood and affect normal.        Behavior: Behavior normal.         Assessment & Plan:   1. Essential hypertension Appears stable. Continue   2. Sodium (Na) deficiency Labs pending  3. Screening for endocrine/metabolic/immunity disorders  - Basic metabolic panel  4. Encounter for weight management Phentermine refilled.   5. Class 3 severe obesity due to excess calories with serious comorbidity and body mass index (BMI) of 45.0 to 49.9 in adult [facility]) Reviewed dietary and activity options  6. Need for shingles vaccine  - Varicella-zoster vaccine IM Return in about 4 weeks (around 09/18/2023) for follow up.   [name], MD

## 2023-08-21 NOTE — Progress Notes (Signed)
4 week follow up, b/p medications refilled   Zoster Recombinant(Shingrix)  given  today

## 2023-08-22 LAB — BASIC METABOLIC PANEL
BUN/Creatinine Ratio: 9 (ref 9–23)
BUN: 10 mg/dL (ref 6–24)
CO2: 26 mmol/L (ref 20–29)
Calcium: 10.3 mg/dL — ABNORMAL HIGH (ref 8.7–10.2)
Chloride: 96 mmol/L (ref 96–106)
Creatinine, Ser: 1.09 mg/dL — ABNORMAL HIGH (ref 0.57–1.00)
Glucose: 91 mg/dL (ref 70–99)
Potassium: 4.1 mmol/L (ref 3.5–5.2)
Sodium: 136 mmol/L (ref 134–144)
eGFR: 60 mL/min/{1.73_m2} (ref >59)

## 2023-09-24 ENCOUNTER — Ambulatory Visit: Payer: 59 | Admitting: Family Medicine

## 2023-09-28 ENCOUNTER — Other Ambulatory Visit: Payer: Self-pay | Admitting: Family Medicine

## 2023-10-01 ENCOUNTER — Encounter: Payer: Self-pay | Admitting: Family Medicine

## 2023-10-01 ENCOUNTER — Ambulatory Visit: Payer: 59 | Admitting: Family Medicine

## 2023-10-01 VITALS — BP 127/89 | HR 74 | Temp 98.4°F | Resp 16 | Ht 65.0 in | Wt 244.2 lb

## 2023-10-01 DIAGNOSIS — Z6841 Body Mass Index (BMI) 40.0 and over, adult: Secondary | ICD-10-CM

## 2023-10-01 DIAGNOSIS — E66813 Obesity, class 3: Secondary | ICD-10-CM | POA: Diagnosis not present

## 2023-10-01 DIAGNOSIS — Z7689 Persons encountering health services in other specified circumstances: Secondary | ICD-10-CM | POA: Diagnosis not present

## 2023-10-01 MED ORDER — PHENTERMINE HCL 37.5 MG PO CAPS: 37.5000 mg | ORAL_CAPSULE | ORAL | 0 refills | Status: DC

## 2023-10-02 ENCOUNTER — Encounter: Payer: Self-pay | Admitting: Family Medicine

## 2023-10-02 NOTE — Progress Notes (Signed)
Established Patient Office Visit  Subjective    Patient ID: Deanna Munoz, female    DOB: [birth year]  Age: 57 y.o. MRN: [id number]  CC:  Chief Complaint  Patient presents with   Follow-up    Weight check    HPI [name] presents weight management. Denies acute complaints or concerns.   Outpatient Encounter Medications as of 10/01/2023  Medication Sig   albuterol (VENTOLIN HFA) 108 (90 Base) MCG/ACT inhaler Inhale 2 puffs into the lungs every 6 (six) hours as needed for wheezing or shortness of breath.   cetirizine (ZYRTEC) 10 MG tablet Take 1 tablet (10 mg total) by mouth daily.   chlorthalidone (HYGROTON) 50 MG tablet Take 1 tablet (50 mg total) by mouth daily.   HYDROcodone-acetaminophen (NORCO/VICODIN) 5-325 MG tablet Take one or two tabs by mouth as needed up to a max of 6 tablets a day for acute knee pain   methocarbamol (ROBAXIN) 500 MG tablet Take 1 tablet (500 mg total) by mouth 2 (two) times daily.   metoprolol succinate (TOPROL-XL) 25 MG 24 hr tablet Take 1 tablet (25 mg total) by mouth daily.   mirtazapine (REMERON) 30 MG tablet Take 1 tablet (30 mg total) by mouth at bedtime.   montelukast (SINGULAIR) 10 MG tablet Take 1 tablet (10 mg total) by mouth at bedtime.   [DISCONTINUED] phentermine 37.5 MG capsule Take 1 capsule (37.5 mg total) by mouth every morning.   phentermine 37.5 MG capsule Take 1 capsule (37.5 mg total) by mouth every morning.   No facility-administered encounter medications on file as of 10/01/2023.    Past Medical History:  Diagnosis Date   Hypertension    Obesity     History reviewed. No pertinent surgical history.  History reviewed. No pertinent family history.  Social History   Socioeconomic History   Marital status: Married    Spouse name: Not on file   Number of children: Not on file   Years of education: Not on file   Highest education level: Not on file  Occupational History   Not on file  Tobacco Use   Smoking  status: Never   Smokeless tobacco: Never  Substance and Sexual Activity   Alcohol use: No   Drug use: No   Sexual activity: Not on file  Other Topics Concern   Not on file  Social History Narrative   Not on file   Social Determinants of Health   Financial Resource Strain: Low Risk  (03/30/2023)   Overall Financial Resource Strain (CARDIA)    Difficulty of Paying Living Expenses: Not very hard  Food Insecurity: No Food Insecurity (03/30/2023)   Hunger Vital Sign    Worried About Running Out of Food in the Last Year: Never true    Ran Out of Food in the Last Year: Never true  Transportation Needs: No Transportation Needs (03/30/2023)   PRAPARE - Administrator, Civil Service (Medical): No    Lack of Transportation (Non-Medical): No  Physical Activity: Insufficiently Active (03/30/2023)   Exercise Vital Sign    Days of Exercise per Week: 3 days    Minutes of Exercise per Session: 30 min  Stress: No Stress Concern Present (03/30/2023)   [name] of Occupational Health - Occupational Stress Questionnaire    Feeling of Stress : Not at all  Social Connections: Socially Integrated (03/30/2023)   Social Connection and Isolation Panel [NHANES]    Frequency of Communication with Friends and Family: More than three times  a week    Frequency of Social Gatherings with Friends and Family: More than three times a week    Attends Religious Services: More than 4 times per year    Active Member of Clubs or Organizations: Patient unable to answer    Attends Banker Meetings: 1 to 4 times per year    Marital Status: Married  Catering manager Violence: Not At Risk (03/30/2023)   Humiliation, Afraid, Rape, and Kick questionnaire    Fear of Current or Ex-Partner: No    Emotionally Abused: No    Physically Abused: No    Sexually Abused: No    Review of Systems  All other systems reviewed and are negative.       Objective    BP 127/89 (BP Location: Right Arm,  Patient Position: Sitting, Cuff Size: Large)   Pulse 74   Temp 98.4 F (36.9 C) (Oral)   Resp 16   Ht 5\' 5"  (1.651 m)   Wt 244 lb 3.2 oz (110.8 kg)   SpO2 99%   BMI 40.64 kg/m   Physical Exam Vitals and nursing note reviewed.  Constitutional:      General: She is not in acute distress.    Appearance: She is obese.  Cardiovascular:     Rate and Rhythm: Normal rate and regular rhythm.  Pulmonary:     Effort: Pulmonary effort is normal.     Breath sounds: Normal breath sounds.  Neurological:     General: No focal deficit present.     Mental Status: She is alert and oriented to person, place, and time.  Psychiatric:        Mood and Affect: Affect normal.         Assessment & Plan:   Encounter for weight management  Class 3 severe obesity due to excess calories with serious comorbidity and body mass index (BMI) of 40.0 to 44.9 in adult [facility])  Other orders -     Phentermine HCl; Take 1 capsule (37.5 mg total) by mouth every morning.  Dispense: 30 capsule; Refill: 0     Return in about 4 weeks (around 10/29/2023) for follow up.   Deanna Raymond, MD

## 2023-10-07 IMAGING — MG MM DIGITAL SCREENING BILAT W/ TOMO AND CAD
8 series · 8 of 24 positions shown · non-contrast
Comparison: Previous exam(s).

CLINICAL DATA: Screening.

EXAM:
DIGITAL SCREENING BILATERAL MAMMOGRAM WITH TOMOSYNTHESIS AND CAD
TECHNIQUE: Bilateral screening digital craniocaudal and mediolateral oblique
mammograms were obtained. Bilateral screening digital breast
tomosynthesis was performed. The images were evaluated with
computer-aided detection.

[L CC synth-2D]
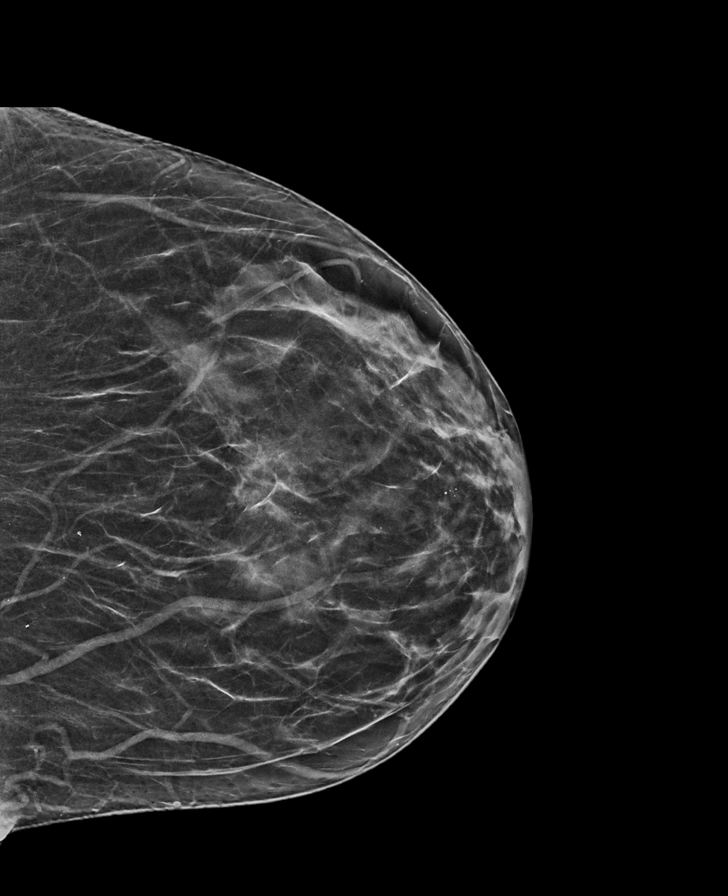

[R MLO synth-2D]
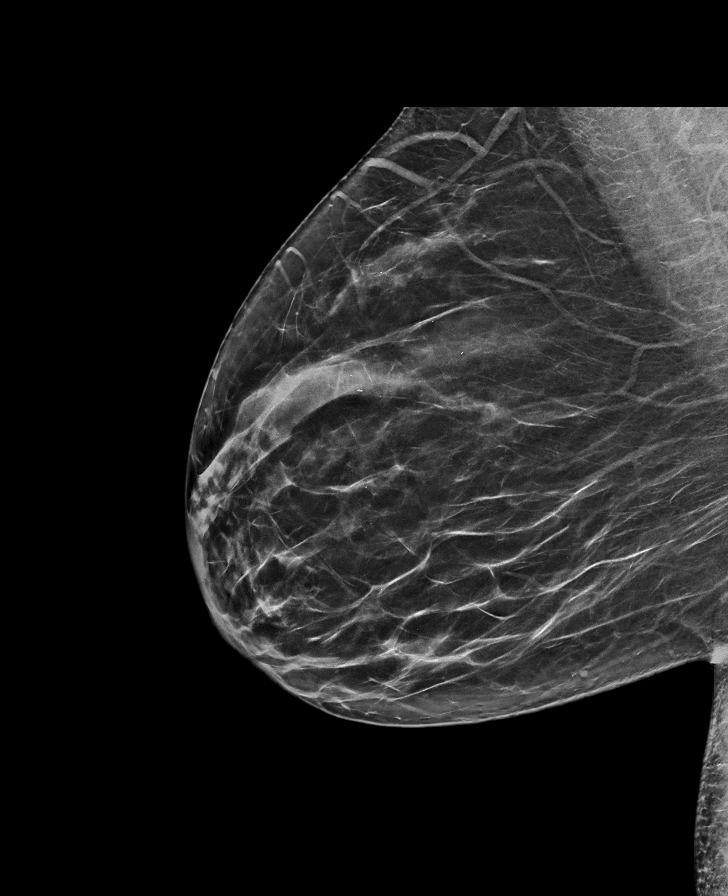

[R CC synth-2D]
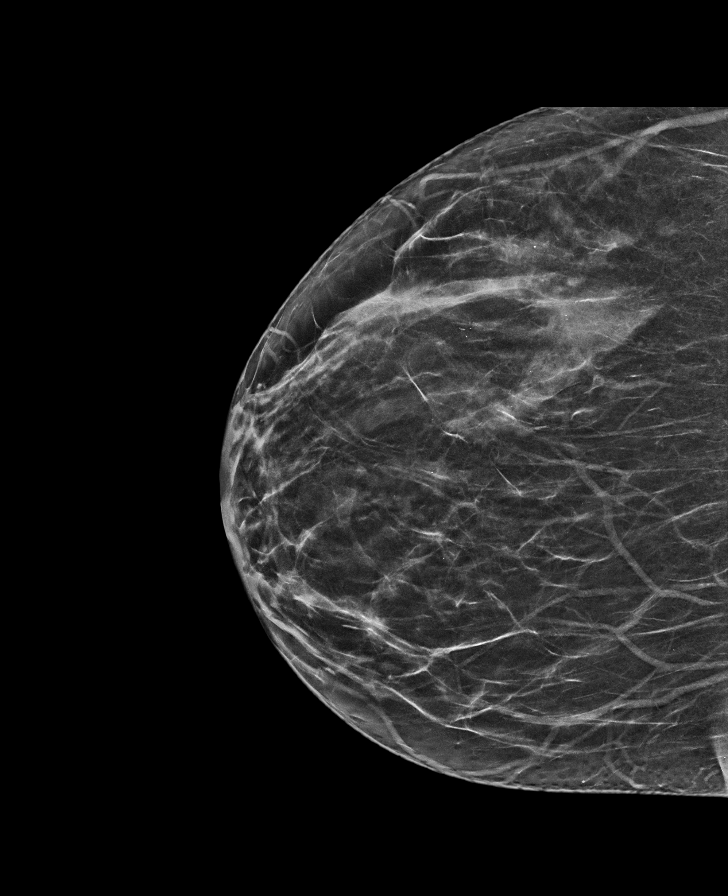

[L MLO synth-2D]
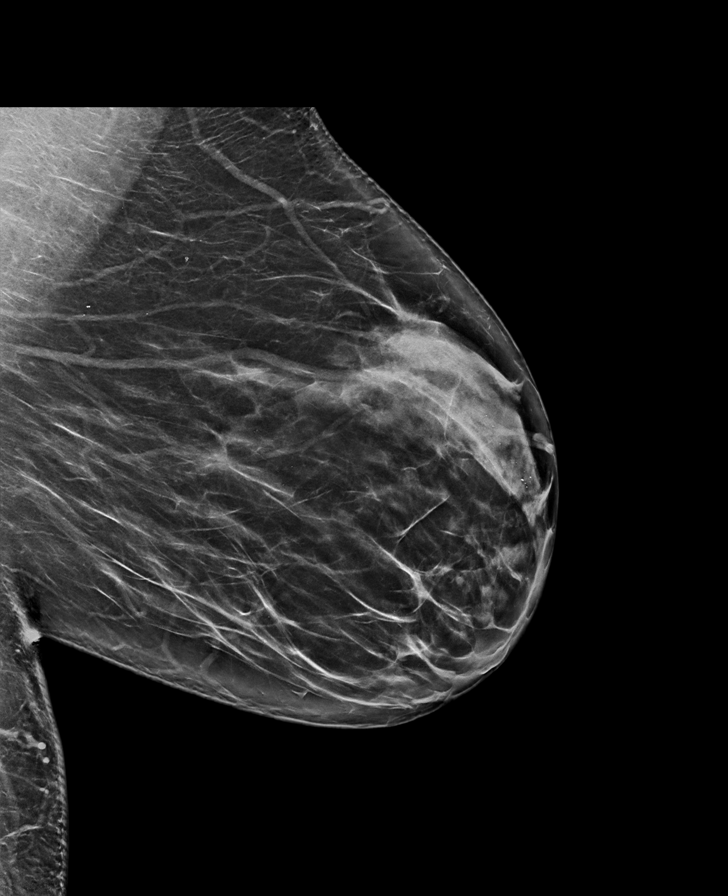

[L CC tomo · tomo slice 33/65.0]
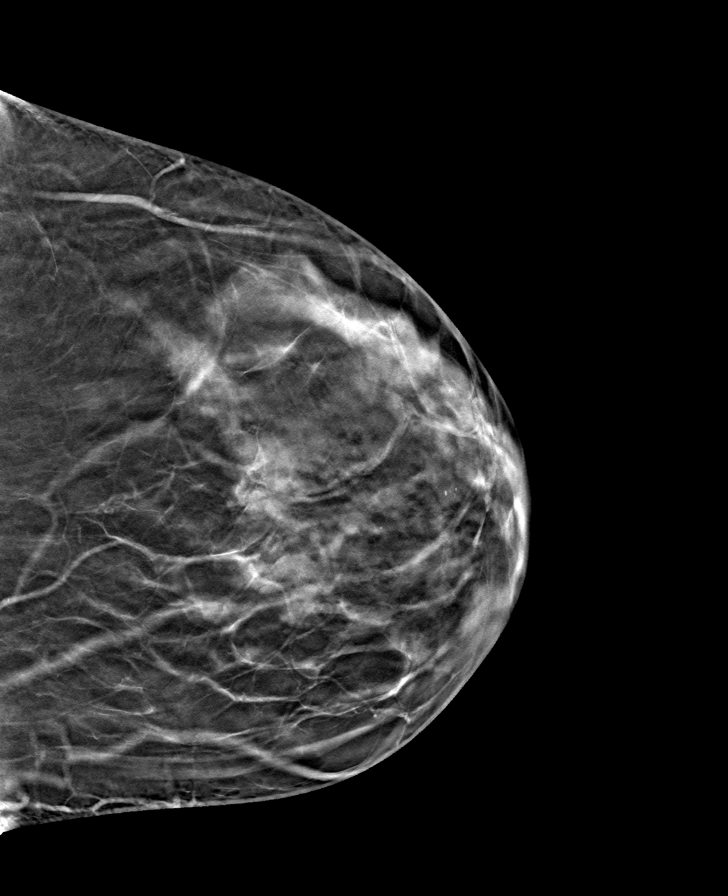

[L MLO tomo · tomo slice 39/78.0]
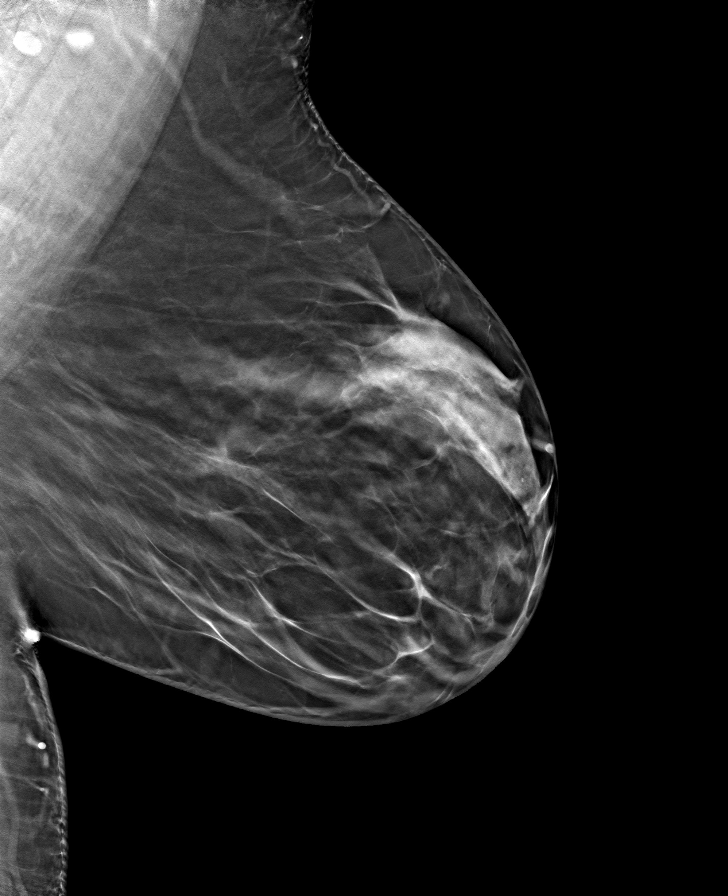

[R MLO tomo · tomo slice 39/76.0]
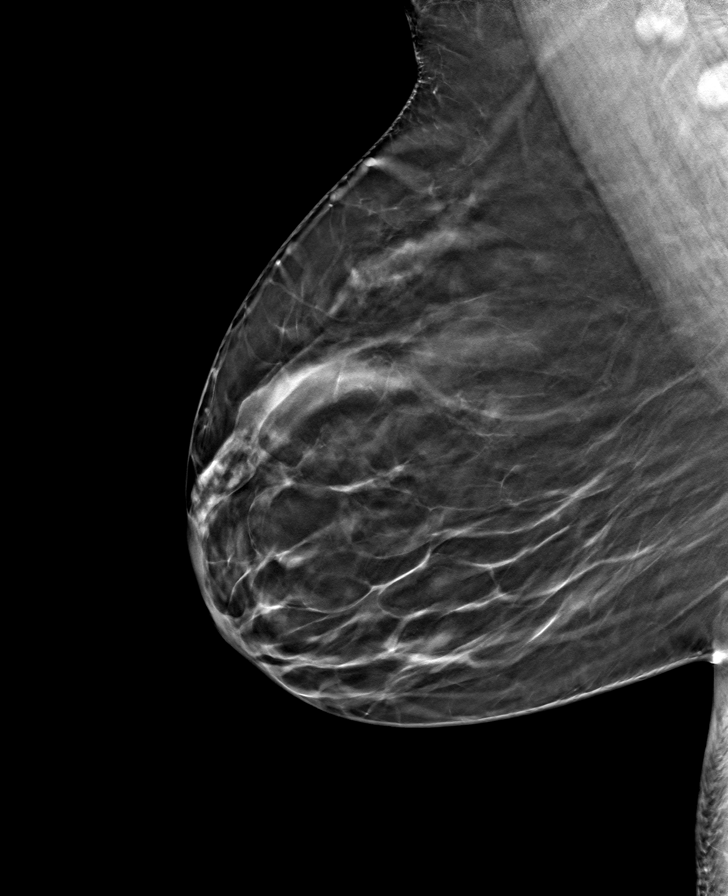

[R CC tomo · tomo slice 33/65.0]
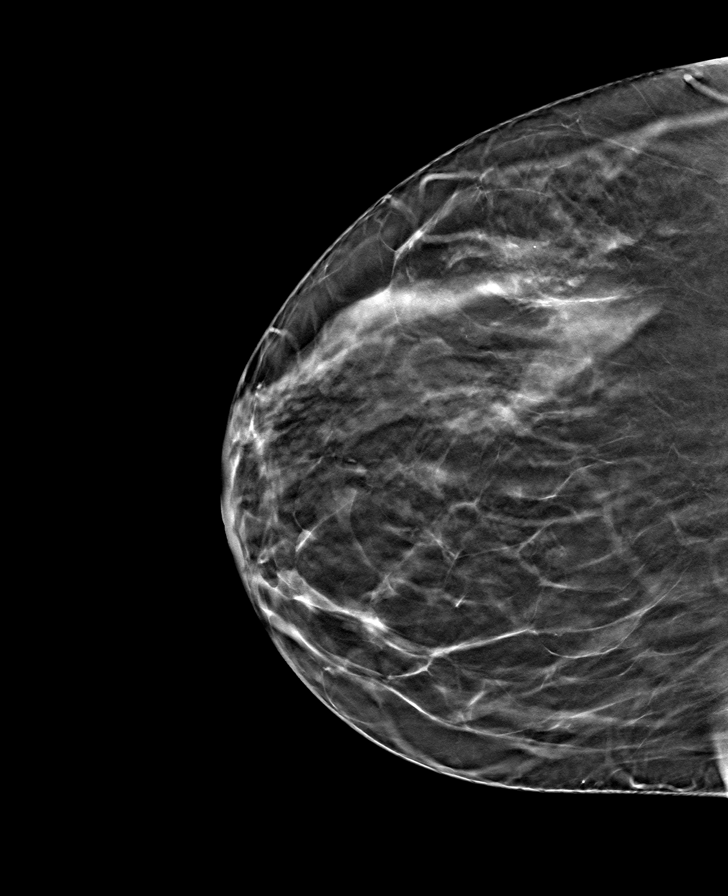

[8 of 24 positions shown; findings below may reference images not displayed]

ACR Breast Density Category c: The breast tissue is heterogeneously
dense, which may obscure small masses.
FINDINGS: There are no findings suspicious for malignancy.
IMPRESSION: No mammographic evidence of malignancy. A result letter of this
screening mammogram will be mailed directly to the patient.

RECOMMENDATION:
Screening mammogram in one year. (Code:Q3-W-BC3)

BI-RADS CATEGORY  1: Negative.

## 2023-11-06 ENCOUNTER — Ambulatory Visit: Payer: 59 | Admitting: Family Medicine

## 2023-11-06 ENCOUNTER — Other Ambulatory Visit: Payer: Self-pay | Admitting: Family Medicine

## 2023-11-06 ENCOUNTER — Encounter: Payer: Self-pay | Admitting: Family Medicine

## 2023-11-06 VITALS — BP 124/86 | HR 78 | Temp 98.6°F | Resp 16 | Ht 65.0 in | Wt 246.0 lb

## 2023-11-06 DIAGNOSIS — Z6841 Body Mass Index (BMI) 40.0 and over, adult: Secondary | ICD-10-CM | POA: Diagnosis not present

## 2023-11-06 DIAGNOSIS — I10 Essential (primary) hypertension: Secondary | ICD-10-CM

## 2023-11-06 DIAGNOSIS — Z7689 Persons encountering health services in other specified circumstances: Secondary | ICD-10-CM | POA: Diagnosis not present

## 2023-11-06 DIAGNOSIS — E66813 Obesity, class 3: Secondary | ICD-10-CM | POA: Diagnosis not present

## 2023-11-08 ENCOUNTER — Encounter: Payer: Self-pay | Admitting: Family Medicine

## 2023-11-08 NOTE — Progress Notes (Signed)
Established Patient Office Visit  Subjective    Patient ID: Deanna Munoz, female    DOB: 10-18-1966  Age: 57 y.o. MRN: [id number]  CC:  Chief Complaint  Patient presents with   Follow-up    Medication refills    HPI Deanna Munoz presents for routine weight management. She has had stressors which she reports has contributed to her not losing weight.   Outpatient Encounter Medications as of 11/06/2023  Medication Sig   albuterol (VENTOLIN HFA) 108 (90 Base) MCG/ACT inhaler Inhale 2 puffs into the lungs every 6 (six) hours as needed for wheezing or shortness of breath.   cetirizine (ZYRTEC) 10 MG tablet Take 1 tablet (10 mg total) by mouth daily.   chlorthalidone (HYGROTON) 50 MG tablet Take 1 tablet (50 mg total) by mouth daily.   HYDROcodone-acetaminophen (NORCO/VICODIN) 5-325 MG tablet Take one or two tabs by mouth as needed up to a max of 6 tablets a day for acute knee pain   methocarbamol (ROBAXIN) 500 MG tablet Take 1 tablet (500 mg total) by mouth 2 (two) times daily.   metoprolol succinate (TOPROL-XL) 25 MG 24 hr tablet Take 1 tablet (25 mg total) by mouth daily.   mirtazapine (REMERON) 30 MG tablet Take 1 tablet (30 mg total) by mouth at bedtime.   montelukast (SINGULAIR) 10 MG tablet Take 1 tablet (10 mg total) by mouth at bedtime.   phentermine 37.5 MG capsule Take 1 capsule (37.5 mg total) by mouth every morning.   No facility-administered encounter medications on file as of 11/06/2023.    Past Medical History:  Diagnosis Date   Hypertension    Obesity     History reviewed. No pertinent surgical history.  History reviewed. No pertinent family history.  Social History   Socioeconomic History   Marital status: Married    Spouse name: Not on file   Number of children: Not on file   Years of education: Not on file   Highest education level: Not on file  Occupational History   Not on file  Tobacco Use   Smoking status: Never   Smokeless tobacco: Never   Substance and Sexual Activity   Alcohol use: No   Drug use: No   Sexual activity: Not on file  Other Topics Concern   Not on file  Social History Narrative   Not on file   Social Determinants of Health   Financial Resource Strain: Low Risk  (03/30/2023)   Overall Financial Resource Strain (CARDIA)    Difficulty of Paying Living Expenses: Not very hard  Food Insecurity: No Food Insecurity (03/30/2023)   Hunger Vital Sign    Worried About Running Out of Food in the Last Year: Never true    Ran Out of Food in the Last Year: Never true  Transportation Needs: No Transportation Needs (03/30/2023)   PRAPARE - Administrator, Civil Service (Medical): No    Lack of Transportation (Non-Medical): No  Physical Activity: Insufficiently Active (03/30/2023)   Exercise Vital Sign    Days of Exercise per Week: 3 days    Minutes of Exercise per Session: 30 min  Stress: No Stress Concern Present (03/30/2023)   [name] of Occupational Health - Occupational Stress Questionnaire    Feeling of Stress : Not at all  Social Connections: Socially Integrated (03/30/2023)   Social Connection and Isolation Panel [NHANES]    Frequency of Communication with Friends and Family: More than three times a week    Frequency  of Social Gatherings with Friends and Family: More than three times a week    Attends Religious Services: More than 4 times per year    Active Member of [employer] or Organizations: Patient unable to answer    Attends Banker Meetings: 1 to 4 times per year    Marital Status: Married  Catering manager Violence: Not At Risk (03/30/2023)   Humiliation, Afraid, Rape, and Kick questionnaire    Fear of Current or Ex-Partner: No    Emotionally Abused: No    Physically Abused: No    Sexually Abused: No    Review of Systems  All other systems reviewed and are negative.       Objective    BP 124/86 (BP Location: Right Arm, Patient Position: Sitting, Cuff Size:  Large)   Pulse 78   Temp 98.6 F (37 C) (Oral)   Resp 16   Ht 5\' 5"  (1.651 m)   Wt 246 lb (111.6 kg)   SpO2 93%   BMI 40.94 kg/m   Physical Exam Vitals and nursing note reviewed.  Constitutional:      General: She is not in acute distress. Cardiovascular:     Rate and Rhythm: Normal rate and regular rhythm.  Pulmonary:     Effort: Pulmonary effort is normal.     Breath sounds: Normal breath sounds.  Abdominal:     Palpations: Abdomen is soft.     Tenderness: There is no abdominal tenderness.  Neurological:     General: No focal deficit present.     Mental Status: She is alert and oriented to person, place, and time.         Assessment & Plan:  1. Encounter for weight management Discussed at length. Will take a hiatus on phentermine. Patient to utilize dietary and activity options and we will reconsider use of meds in 2-3 months. Patient v.u.  2. Class 3 severe obesity due to excess calories with serious comorbidity and body mass index (BMI) of 40.0 to 44.9 in adult (HCC)   3. Essential hypertension Appears stable. continue     Return in about 2 months (around 01/06/2024) for follow up.   [name], MD

## 2024-01-21 ENCOUNTER — Ambulatory Visit: Payer: 59 | Admitting: Family Medicine

## 2024-02-13 ENCOUNTER — Ambulatory Visit: Payer: 59 | Admitting: Family Medicine

## 2024-02-13 VITALS — BP 135/85 | HR 74 | Temp 97.8°F | Resp 16 | Ht 64.0 in | Wt 253.0 lb

## 2024-02-13 DIAGNOSIS — Z713 Dietary counseling and surveillance: Secondary | ICD-10-CM | POA: Diagnosis not present

## 2024-02-13 DIAGNOSIS — I10 Essential (primary) hypertension: Secondary | ICD-10-CM | POA: Diagnosis not present

## 2024-02-13 DIAGNOSIS — Z7689 Persons encountering health services in other specified circumstances: Secondary | ICD-10-CM | POA: Diagnosis not present

## 2024-02-13 DIAGNOSIS — E66813 Obesity, class 3: Secondary | ICD-10-CM | POA: Diagnosis not present

## 2024-02-13 DIAGNOSIS — Z6841 Body Mass Index (BMI) 40.0 and over, adult: Secondary | ICD-10-CM | POA: Diagnosis not present

## 2024-02-13 MED ORDER — CHLORTHALIDONE 50 MG PO TABS: 50.0000 mg | ORAL_TABLET | Freq: Every day | ORAL | 1 refills | Status: DC

## 2024-02-13 MED ORDER — PHENTERMINE HCL 37.5 MG PO CAPS: 37.5000 mg | ORAL_CAPSULE | ORAL | 0 refills | Status: DC

## 2024-02-13 MED ORDER — METOPROLOL SUCCINATE ER 25 MG PO TB24: 25.0000 mg | ORAL_TABLET | Freq: Every day | ORAL | 1 refills | Status: DC

## 2024-02-13 NOTE — Progress Notes (Unsigned)
 Established Patient Office Visit  Subjective    Patient ID: Deanna Munoz, female    DOB: 02/19/1966  Age: 58 y.o. MRN: [id number]  CC:  Chief Complaint  Patient presents with   Medical Management of Chronic Issues   Weight Loss    HPI Armida Feeley presents for routine weight management. She has been off of any meds for about 3 months and would like to restart phentermine.   Outpatient Encounter Medications as of 02/13/2024  Medication Sig   albuterol (VENTOLIN HFA) 108 (90 Base) MCG/ACT inhaler Inhale 2 puffs into the lungs every 6 (six) hours as needed for wheezing or shortness of breath.   cetirizine (ZYRTEC) 10 MG tablet Take 1 tablet (10 mg total) by mouth daily.   chlorthalidone (HYGROTON) 50 MG tablet Take 1 tablet (50 mg total) by mouth daily.   HYDROcodone-acetaminophen (NORCO/VICODIN) 5-325 MG tablet Take one or two tabs by mouth as needed up to a max of 6 tablets a day for acute knee pain   methocarbamol (ROBAXIN) 500 MG tablet Take 1 tablet (500 mg total) by mouth 2 (two) times daily.   metoprolol succinate (TOPROL-XL) 25 MG 24 hr tablet Take 1 tablet (25 mg total) by mouth daily.   mirtazapine (REMERON) 30 MG tablet Take 1 tablet (30 mg total) by mouth at bedtime.   montelukast (SINGULAIR) 10 MG tablet Take 1 tablet (10 mg total) by mouth at bedtime.   phentermine 37.5 MG capsule Take 1 capsule (37.5 mg total) by mouth every morning.   No facility-administered encounter medications on file as of 02/13/2024.    Past Medical History:  Diagnosis Date   Hypertension    Obesity     No past surgical history on file.  No family history on file.  Social History   Socioeconomic History   Marital status: Married    Spouse name: Not on file   Number of children: Not on file   Years of education: Not on file   Highest education level: Not on file  Occupational History   Not on file  Tobacco Use   Smoking status: Never   Smokeless tobacco: Never  Substance  and Sexual Activity   Alcohol use: No   Drug use: No   Sexual activity: Not on file  Other Topics Concern   Not on file  Social History Narrative   Not on file   Social Drivers of Health   Financial Resource Strain: Low Risk  (03/30/2023)   Overall Financial Resource Strain (CARDIA)    Difficulty of Paying Living Expenses: Not very hard  Food Insecurity: No Food Insecurity (03/30/2023)   Hunger Vital Sign    Worried About Running Out of Food in the Last Year: Never true    Ran Out of Food in the Last Year: Never true  Transportation Needs: No Transportation Needs (03/30/2023)   PRAPARE - Administrator, Civil Service (Medical): No    Lack of Transportation (Non-Medical): No  Physical Activity: Insufficiently Active (03/30/2023)   Exercise Vital Sign    Days of Exercise per Week: 3 days    Minutes of Exercise per Session: 30 min  Stress: No Stress Concern Present (03/30/2023)   [name] of Occupational Health - Occupational Stress Questionnaire    Feeling of Stress : Not at all  Social Connections: Socially Integrated (03/30/2023)   Social Connection and Isolation Panel [NHANES]    Frequency of Communication with Friends and Family: More than three times a  week    Frequency of Social Gatherings with Friends and Family: More than three times a week    Attends Religious Services: More than 4 times per year    Active Member of Clubs or Organizations: Patient unable to answer    Attends Banker Meetings: 1 to 4 times per year    Marital Status: Married  Catering manager Violence: Not At Risk (03/30/2023)   Humiliation, Afraid, Rape, and Kick questionnaire    Fear of Current or Ex-Partner: No    Emotionally Abused: No    Physically Abused: No    Sexually Abused: No    Review of Systems  All other systems reviewed and are negative.       Objective    BP 135/85   Pulse 74   Temp 97.8 F (36.6 C) (Oral)   Resp 16   Ht 5\' 4"  (1.626 m)   Wt  253 lb (114.8 kg)   SpO2 99%   BMI 43.43 kg/m   Physical Exam Vitals and nursing note reviewed.  Constitutional:      General: She is not in acute distress. Cardiovascular:     Rate and Rhythm: Normal rate and regular rhythm.  Pulmonary:     Effort: Pulmonary effort is normal.     Breath sounds: Normal breath sounds.  Abdominal:     Palpations: Abdomen is soft.     Tenderness: There is no abdominal tenderness.  Neurological:     General: No focal deficit present.     Mental Status: She is alert and oriented to person, place, and time.     {Labs (Optional):23779}    Assessment & Plan:   There are no diagnoses linked to this encounter.   No follow-ups on file.   [name], MD

## 2024-02-15 ENCOUNTER — Encounter: Payer: Self-pay | Admitting: Family Medicine

## 2024-03-12 ENCOUNTER — Ambulatory Visit (INDEPENDENT_AMBULATORY_CARE_PROVIDER_SITE_OTHER): Payer: 59 | Admitting: Family Medicine

## 2024-03-12 ENCOUNTER — Encounter: Payer: Self-pay | Admitting: Family Medicine

## 2024-03-12 VITALS — BP 126/84 | HR 68 | Temp 98.2°F | Resp 16 | Ht 64.0 in | Wt 246.8 lb

## 2024-03-12 DIAGNOSIS — E66813 Obesity, class 3: Secondary | ICD-10-CM

## 2024-03-12 DIAGNOSIS — Z6841 Body Mass Index (BMI) 40.0 and over, adult: Secondary | ICD-10-CM | POA: Diagnosis not present

## 2024-03-12 DIAGNOSIS — Z7689 Persons encountering health services in other specified circumstances: Secondary | ICD-10-CM

## 2024-03-12 DIAGNOSIS — I10 Essential (primary) hypertension: Secondary | ICD-10-CM | POA: Diagnosis not present

## 2024-03-12 MED ORDER — PHENTERMINE HCL 37.5 MG PO CAPS: 37.5000 mg | ORAL_CAPSULE | ORAL | 0 refills | Status: DC

## 2024-03-12 NOTE — Progress Notes (Signed)
 Established Patient Office Visit  Subjective    Patient ID: Deanna Munoz, female    DOB: [birth year]  Age: 58 y.o. MRN: [id number]  CC:  Chief Complaint  Patient presents with   Follow-up    One month    HPI Deanna Munoz presents for routine weight management. She reports doing well with meds and has also been improving her diet and activities. Patient denies acute complaints.   Outpatient Encounter Medications as of 03/12/2024  Medication Sig   albuterol (VENTOLIN HFA) 108 (90 Base) MCG/ACT inhaler Inhale 2 puffs into the lungs every 6 (six) hours as needed for wheezing or shortness of breath.   cetirizine (ZYRTEC) 10 MG tablet Take 1 tablet (10 mg total) by mouth daily.   chlorthalidone (HYGROTON) 50 MG tablet Take 1 tablet (50 mg total) by mouth daily.   HYDROcodone-acetaminophen (NORCO/VICODIN) 5-325 MG tablet Take one or two tabs by mouth as needed up to a max of 6 tablets a day for acute knee pain   methocarbamol (ROBAXIN) 500 MG tablet Take 1 tablet (500 mg total) by mouth 2 (two) times daily.   metoprolol succinate (TOPROL-XL) 25 MG 24 hr tablet Take 1 tablet (25 mg total) by mouth daily.   mirtazapine (REMERON) 30 MG tablet Take 1 tablet (30 mg total) by mouth at bedtime.   montelukast (SINGULAIR) 10 MG tablet Take 1 tablet (10 mg total) by mouth at bedtime.   [DISCONTINUED] phentermine 37.5 MG capsule Take 1 capsule (37.5 mg total) by mouth every morning.   phentermine 37.5 MG capsule Take 1 capsule (37.5 mg total) by mouth every morning.   No facility-administered encounter medications on file as of 03/12/2024.    Past Medical History:  Diagnosis Date   Hypertension    Obesity     History reviewed. No pertinent surgical history.  History reviewed. No pertinent family history.  Social History   Socioeconomic History   Marital status: Married    Spouse name: Not on file   Number of children: Not on file   Years of education: Not on file   Highest  education level: Not on file  Occupational History   Not on file  Tobacco Use   Smoking status: Never   Smokeless tobacco: Never  Vaping Use   Vaping status: Never Used  Substance and Sexual Activity   Alcohol use: No   Drug use: No   Sexual activity: Not on file  Other Topics Concern   Not on file  Social History Narrative   Not on file   Social Drivers of Health   Financial Resource Strain: Low Risk  (03/30/2023)   Overall Financial Resource Strain (CARDIA)    Difficulty of Paying Living Expenses: Not very hard  Food Insecurity: No Food Insecurity (03/30/2023)   Hunger Vital Sign    Worried About Running Out of Food in the Last Year: Never true    Ran Out of Food in the Last Year: Never true  Transportation Needs: No Transportation Needs (03/30/2023)   PRAPARE - Administrator, Civil Service (Medical): No    Lack of Transportation (Non-Medical): No  Physical Activity: Insufficiently Active (03/30/2023)   Exercise Vital Sign    Days of Exercise per Week: 3 days    Minutes of Exercise per Session: 30 min  Stress: No Stress Concern Present (03/30/2023)   [name] of Occupational Health - Occupational Stress Questionnaire    Feeling of Stress : Not at all  Social Connections:  Socially Integrated (03/30/2023)   Social Connection and Isolation Panel [NHANES]    Frequency of Communication with Friends and Family: More than three times a week    Frequency of Social Gatherings with Friends and Family: More than three times a week    Attends Religious Services: More than 4 times per year    Active Member of [employer] or Organizations: Patient unable to answer    Attends Banker Meetings: 1 to 4 times per year    Marital Status: Married  Catering manager Violence: Not At Risk (03/30/2023)   Humiliation, Afraid, Rape, and Kick questionnaire    Fear of Current or Ex-Partner: No    Emotionally Abused: No    Physically Abused: No    Sexually Abused: No     Review of Systems  All other systems reviewed and are negative.       Objective    BP 126/84   Pulse 68   Temp 98.2 F (36.8 C) (Oral)   Resp 16   Ht 5\' 4"  (1.626 m)   Wt 246 lb 12.8 oz (111.9 kg)   SpO2 95%   BMI 42.36 kg/m   Physical Exam Vitals and nursing note reviewed.  Constitutional:      General: She is not in acute distress. Cardiovascular:     Rate and Rhythm: Normal rate and regular rhythm.  Pulmonary:     Effort: Pulmonary effort is normal.     Breath sounds: Normal breath sounds.  Abdominal:     Palpations: Abdomen is soft.     Tenderness: There is no abdominal tenderness.  Neurological:     General: No focal deficit present.     Mental Status: She is alert and oriented to person, place, and time.         Assessment & Plan:   Encounter for weight management  Class 3 severe obesity due to excess calories with serious comorbidity and body mass index (BMI) of 40.0 to 44.9 in adult [facility])  Essential hypertension  Other orders -     Phentermine HCl; Take 1 capsule (37.5 mg total) by mouth every morning.  Dispense: 30 capsule; Refill: 0     Return in about 4 weeks (around 04/09/2024) for follow up, weight management.   [name], MD

## 2024-04-14 ENCOUNTER — Ambulatory Visit: Admitting: Family Medicine

## 2024-04-25 ENCOUNTER — Other Ambulatory Visit: Payer: Self-pay | Admitting: Family Medicine

## 2024-05-14 ENCOUNTER — Encounter: Payer: Self-pay | Admitting: Family Medicine

## 2024-05-14 ENCOUNTER — Ambulatory Visit (INDEPENDENT_AMBULATORY_CARE_PROVIDER_SITE_OTHER): Admitting: Family Medicine

## 2024-05-14 VITALS — BP 131/86 | HR 78 | Wt 250.0 lb

## 2024-05-14 DIAGNOSIS — Z7689 Persons encountering health services in other specified circumstances: Secondary | ICD-10-CM | POA: Diagnosis not present

## 2024-05-14 DIAGNOSIS — F329 Major depressive disorder, single episode, unspecified: Secondary | ICD-10-CM

## 2024-05-14 DIAGNOSIS — E66813 Obesity, class 3: Secondary | ICD-10-CM | POA: Diagnosis not present

## 2024-05-14 DIAGNOSIS — Z6841 Body Mass Index (BMI) 40.0 and over, adult: Secondary | ICD-10-CM | POA: Diagnosis not present

## 2024-05-14 MED ORDER — CETIRIZINE HCL 10 MG PO TABS: 10.0000 mg | ORAL_TABLET | Freq: Every day | ORAL | 1 refills | Status: AC

## 2024-05-14 MED ORDER — PHENTERMINE HCL 37.5 MG PO CAPS: 37.5000 mg | ORAL_CAPSULE | ORAL | 0 refills | Status: DC

## 2024-05-14 MED ORDER — MIRTAZAPINE 45 MG PO TABS: 45.0000 mg | ORAL_TABLET | Freq: Every day | ORAL | 0 refills | Status: DC

## 2024-05-14 NOTE — Progress Notes (Signed)
 Established Patient Office Visit  Subjective    Patient ID: Deanna Munoz, female    DOB: 12-31-1965  Age: 58 y.o. MRN: [id number]  CC:  Chief Complaint  Patient presents with   Annual Exam   Medication Refill    HPI Deanna Munoz presents for weight management follow up. Patient also reports that she had difficulty following her regimen as she had a recent anniversary of the death of a family member that has been extremely difficult for her.   Outpatient Encounter Medications as of 05/14/2024  Medication Sig   albuterol  (VENTOLIN  HFA) 108 (90 Base) MCG/ACT inhaler Inhale 2 puffs into the lungs every 6 (six) hours as needed for wheezing or shortness of breath.   cetirizine  (ZYRTEC ) 10 MG tablet Take 1 tablet (10 mg total) by mouth daily.   chlorthalidone  (HYGROTON ) 50 MG tablet Take 1 tablet (50 mg total) by mouth daily.   metoprolol  succinate (TOPROL -XL) 25 MG 24 hr tablet Take 1 tablet (25 mg total) by mouth daily.   mirtazapine  (REMERON ) 30 MG tablet Take 1 tablet (30 mg total) by mouth at bedtime.   mirtazapine  (REMERON ) 45 MG tablet Take 1 tablet (45 mg total) by mouth at bedtime.   montelukast  (SINGULAIR ) 10 MG tablet Take 1 tablet (10 mg total) by mouth at bedtime.   [DISCONTINUED] phentermine  37.5 MG capsule Take 1 capsule (37.5 mg total) by mouth every morning.   HYDROcodone -acetaminophen  (NORCO/VICODIN) 5-325 MG tablet Take one or two tabs by mouth as needed up to a max of 6 tablets a day for acute knee pain   methocarbamol  (ROBAXIN ) 500 MG tablet Take 1 tablet (500 mg total) by mouth 2 (two) times daily. (Patient not taking: Reported on 05/14/2024)   phentermine  37.5 MG capsule Take 1 capsule (37.5 mg total) by mouth every morning.   No facility-administered encounter medications on file as of 05/14/2024.    Past Medical History:  Diagnosis Date   Hypertension    Obesity     History reviewed. No pertinent surgical history.  History reviewed. No pertinent family  history.  Social History   Socioeconomic History   Marital status: Married    Spouse name: Not on file   Number of children: Not on file   Years of education: Not on file   Highest education level: Not on file  Occupational History   Not on file  Tobacco Use   Smoking status: Never   Smokeless tobacco: Never  Vaping Use   Vaping status: Never Used  Substance and Sexual Activity   Alcohol use: No   Drug use: No   Sexual activity: Not on file  Other Topics Concern   Not on file  Social History Narrative   Not on file   Social Drivers of Health   Financial Resource Strain: Low Risk  (03/30/2023)   Overall Financial Resource Strain (CARDIA)    Difficulty of Paying Living Expenses: Not very hard  Food Insecurity: No Food Insecurity (03/30/2023)   Hunger Vital Sign    Worried About Running Out of Food in the Last Year: Never true    Ran Out of Food in the Last Year: Never true  Transportation Needs: No Transportation Needs (03/30/2023)   PRAPARE - Administrator, Civil Service (Medical): No    Lack of Transportation (Non-Medical): No  Physical Activity: Insufficiently Active (03/30/2023)   Exercise Vital Sign    Days of Exercise per Week: 3 days    Minutes of Exercise  per Session: 30 min  Stress: No Stress Concern Present (03/30/2023)   [name] of Occupational Health - Occupational Stress Questionnaire    Feeling of Stress : Not at all  Social Connections: Socially Integrated (03/30/2023)   Social Connection and Isolation Panel [NHANES]    Frequency of Communication with Friends and Family: More than three times a week    Frequency of Social Gatherings with Friends and Family: More than three times a week    Attends Religious Services: More than 4 times per year    Active Member of [employer] or Organizations: Patient unable to answer    Attends Banker Meetings: 1 to 4 times per year    Marital Status: Married  Catering manager Violence: Not At  Risk (03/30/2023)   Humiliation, Afraid, Rape, and Kick questionnaire    Fear of Current or Ex-Partner: No    Emotionally Abused: No    Physically Abused: No    Sexually Abused: No    Review of Systems  Psychiatric/Behavioral:  Positive for depression. Negative for suicidal ideas. The patient has insomnia.   All other systems reviewed and are negative.       Objective    BP 131/86 (BP Location: Right Arm, Patient Position: Sitting, Cuff Size: Large)   Pulse 78   Wt 250 lb (113.4 kg)   SpO2 98%   BMI 42.91 kg/m   Physical Exam Vitals and nursing note reviewed.  Constitutional:      General: She is not in acute distress.    Appearance: She is obese.  Cardiovascular:     Rate and Rhythm: Normal rate and regular rhythm.  Pulmonary:     Effort: Pulmonary effort is normal.     Breath sounds: Normal breath sounds.  Abdominal:     Palpations: Abdomen is soft.     Tenderness: There is no abdominal tenderness.  Neurological:     General: No focal deficit present.     Mental Status: She is alert and oriented to person, place, and time.  Psychiatric:        Mood and Affect: Mood is depressed. Affect is tearful.         Assessment & Plan:   Encounter for weight management  Class 3 severe obesity due to excess calories with serious comorbidity and body mass index (BMI) of 40.0 to 44.9 in adult  Reactive depression  Other orders -     Phentermine  HCl; Take 1 capsule (37.5 mg total) by mouth every morning.  Dispense: 30 capsule; Refill: 0 -     Mirtazapine ; Take 1 tablet (45 mg total) by mouth at bedtime.  Dispense: 90 tablet; Refill: 0     Return in about 4 weeks (around 06/11/2024) for physical.   Arlo Lama, MD

## 2024-06-16 ENCOUNTER — Encounter: Payer: Self-pay | Admitting: Family Medicine

## 2024-06-16 ENCOUNTER — Ambulatory Visit (INDEPENDENT_AMBULATORY_CARE_PROVIDER_SITE_OTHER): Admitting: Family Medicine

## 2024-06-16 VITALS — BP 127/91 | HR 82 | Wt 243.4 lb

## 2024-06-16 DIAGNOSIS — E66813 Obesity, class 3: Secondary | ICD-10-CM | POA: Diagnosis not present

## 2024-06-16 DIAGNOSIS — Z1329 Encounter for screening for other suspected endocrine disorder: Secondary | ICD-10-CM

## 2024-06-16 DIAGNOSIS — Z13228 Encounter for screening for other metabolic disorders: Secondary | ICD-10-CM | POA: Diagnosis not present

## 2024-06-16 DIAGNOSIS — Z130 Encounter for screening for diseases of the blood and blood-forming organs and certain disorders involving the immune mechanism: Secondary | ICD-10-CM

## 2024-06-16 DIAGNOSIS — Z0000 Encounter for general adult medical examination without abnormal findings: Secondary | ICD-10-CM | POA: Diagnosis not present

## 2024-06-16 DIAGNOSIS — Z13220 Encounter for screening for lipoid disorders: Secondary | ICD-10-CM | POA: Diagnosis not present

## 2024-06-16 DIAGNOSIS — Z6841 Body Mass Index (BMI) 40.0 and over, adult: Secondary | ICD-10-CM | POA: Diagnosis not present

## 2024-06-16 MED ORDER — PHENTERMINE HCL 37.5 MG PO CAPS: 37.5000 mg | ORAL_CAPSULE | ORAL | 0 refills | Status: AC

## 2024-06-16 NOTE — Progress Notes (Signed)
 Established Patient Office Visit  Subjective    Patient ID: Deanna Munoz, female    DOB: 1966-05-07  Age: 58 y.o. MRN: [id number]  CC:  Chief Complaint  Patient presents with   Annual Exam   Medication Refill    HPI Deanna Munoz presents for routine annual exam. Patient denies acute complaints.   Outpatient Encounter Medications as of 06/16/2024  Medication Sig   albuterol  (VENTOLIN  HFA) 108 (90 Base) MCG/ACT inhaler Inhale 2 puffs into the lungs every 6 (six) hours as needed for wheezing or shortness of breath.   cetirizine  (ZYRTEC ) 10 MG tablet Take 1 tablet (10 mg total) by mouth daily.   chlorthalidone  (HYGROTON ) 50 MG tablet Take 1 tablet (50 mg total) by mouth daily.   HYDROcodone -acetaminophen  (NORCO/VICODIN) 5-325 MG tablet Take one or two tabs by mouth as needed up to a max of 6 tablets a day for acute knee pain   methocarbamol  (ROBAXIN ) 500 MG tablet Take 1 tablet (500 mg total) by mouth 2 (two) times daily.   metoprolol  succinate (TOPROL -XL) 25 MG 24 hr tablet Take 1 tablet (25 mg total) by mouth daily.   mirtazapine  (REMERON ) 30 MG tablet Take 1 tablet (30 mg total) by mouth at bedtime.   mirtazapine  (REMERON ) 45 MG tablet Take 1 tablet (45 mg total) by mouth at bedtime.   montelukast  (SINGULAIR ) 10 MG tablet Take 1 tablet (10 mg total) by mouth at bedtime.   [DISCONTINUED] phentermine  37.5 MG capsule Take 1 capsule (37.5 mg total) by mouth every morning.   phentermine  37.5 MG capsule Take 1 capsule (37.5 mg total) by mouth every morning.   No facility-administered encounter medications on file as of 06/16/2024.    Past Medical History:  Diagnosis Date   Hypertension    Obesity     History reviewed. No pertinent surgical history.  History reviewed. No pertinent family history.  Social History   Socioeconomic History   Marital status: Married    Spouse name: Not on file   Number of children: Not on file   Years of education: Not on file   Highest  education level: Not on file  Occupational History   Not on file  Tobacco Use   Smoking status: Never   Smokeless tobacco: Never  Vaping Use   Vaping status: Never Used  Substance and Sexual Activity   Alcohol use: No   Drug use: No   Sexual activity: Not on file  Other Topics Concern   Not on file  Social History Narrative   Not on file   Social Drivers of Health   Financial Resource Strain: Low Risk  (03/30/2023)   Overall Financial Resource Strain (CARDIA)    Difficulty of Paying Living Expenses: Not very hard  Food Insecurity: No Food Insecurity (03/30/2023)   Hunger Vital Sign    Worried About Running Out of Food in the Last Year: Never true    Ran Out of Food in the Last Year: Never true  Transportation Needs: No Transportation Needs (03/30/2023)   PRAPARE - Administrator, Civil Service (Medical): No    Lack of Transportation (Non-Medical): No  Physical Activity: Insufficiently Active (03/30/2023)   Exercise Vital Sign    Days of Exercise per Week: 3 days    Minutes of Exercise per Session: 30 min  Stress: No Stress Concern Present (03/30/2023)   [name] of Occupational Health - Occupational Stress Questionnaire    Feeling of Stress : Not at all  Social Connections: Socially Integrated (03/30/2023)   Social Connection and Isolation Panel    Frequency of Communication with Friends and Family: More than three times a week    Frequency of Social Gatherings with Friends and Family: More than three times a week    Attends Religious Services: More than 4 times per year    Active Member of [employer] or Organizations: Patient unable to answer    Attends Banker Meetings: 1 to 4 times per year    Marital Status: Married  Catering manager Violence: Not At Risk (03/30/2023)   Humiliation, Afraid, Rape, and Kick questionnaire    Fear of Current or Ex-Partner: No    Emotionally Abused: No    Physically Abused: No    Sexually Abused: No    Review  of Systems  All other systems reviewed and are negative.       Objective    BP (!) 127/91 (BP Location: Right Arm, Patient Position: Sitting, Cuff Size: Normal)   Pulse 82   Wt 243 lb 6.4 oz (110.4 kg)   SpO2 98%   BMI 41.78 kg/m   Physical Exam Vitals and nursing note reviewed.  Constitutional:      General: She is not in acute distress.    Appearance: She is obese.  HENT:     Head: Normocephalic and atraumatic.     Right Ear: Tympanic membrane, ear canal and external ear normal.     Left Ear: Tympanic membrane, ear canal and external ear normal.     Nose: Nose normal.     Mouth/Throat:     Mouth: Mucous membranes are moist.     Pharynx: Oropharynx is clear.   Eyes:     Conjunctiva/sclera: Conjunctivae normal.     Pupils: Pupils are equal, round, and reactive to light.   Neck:     Thyroid: No thyromegaly.   Cardiovascular:     Rate and Rhythm: Normal rate and regular rhythm.     Heart sounds: Normal heart sounds. No murmur heard. Pulmonary:     Effort: Pulmonary effort is normal. No respiratory distress.     Breath sounds: Normal breath sounds.  Abdominal:     General: There is no distension.     Palpations: Abdomen is soft. There is no mass.     Tenderness: There is no abdominal tenderness.   Musculoskeletal:        General: Normal range of motion.     Cervical back: Normal range of motion and neck supple.   Skin:    General: Skin is warm and dry.   Neurological:     General: No focal deficit present.     Mental Status: She is alert and oriented to person, place, and time.   Psychiatric:        Mood and Affect: Mood normal.        Behavior: Behavior normal.         Assessment & Plan:   Annual physical exam -     Comprehensive metabolic panel with GFR  Screening for lipid disorders -     CBC  Screening for deficiency anemia -     Lipid panel  Class 3 severe obesity due to excess calories with serious comorbidity and body mass index (BMI)  of 45.0 to 49.9 in adult  Screening for endocrine/metabolic/immunity disorders -     Hemoglobin A1c -     VITAMIN D  25 Hydroxy (Vit-D Deficiency, Fractures) -  TSH  Other orders -     Phentermine  HCl; Take 1 capsule (37.5 mg total) by mouth every morning.  Dispense: 30 capsule; Refill: 0     Return in about 4 weeks (around 07/14/2024) for follow up, weight management.   Tanda Raguel SQUIBB, MD

## 2024-06-17 LAB — COMPREHENSIVE METABOLIC PANEL WITH GFR
ALT: 14 IU/L (ref 0–32)
AST: 18 IU/L (ref 0–40)
Albumin: 4.3 g/dL (ref 3.8–4.9)
Alkaline Phosphatase: 120 IU/L (ref 44–121)
BUN/Creatinine Ratio: 8 — ABNORMAL LOW (ref 9–23)
BUN: 8 mg/dL (ref 6–24)
Bilirubin Total: 0.3 mg/dL (ref 0.0–1.2)
CO2: 24 mmol/L (ref 20–29)
Calcium: 10.5 mg/dL — ABNORMAL HIGH (ref 8.7–10.2)
Chloride: 92 mmol/L — ABNORMAL LOW (ref 96–106)
Creatinine, Ser: 0.96 mg/dL (ref 0.57–1.00)
Globulin, Total: 3.4 g/dL (ref 1.5–4.5)
Glucose: 88 mg/dL (ref 70–99)
Potassium: 4.1 mmol/L (ref 3.5–5.2)
Sodium: 132 mmol/L — ABNORMAL LOW (ref 134–144)
Total Protein: 7.7 g/dL (ref 6.0–8.5)
eGFR: 69 mL/min/{1.73_m2} (ref >59)

## 2024-06-17 LAB — LIPID PANEL
Chol/HDL Ratio: 4 ratio (ref 0.0–4.4)
Cholesterol, Total: 192 mg/dL (ref 100–199)
HDL: 48 mg/dL (ref >39)
LDL Chol Calc (NIH): 127 mg/dL — ABNORMAL HIGH (ref 0–99)
Triglycerides: 94 mg/dL (ref 0–149)
VLDL Cholesterol Cal: 17 mg/dL (ref 5–40)

## 2024-06-17 LAB — CBC
Hematocrit: 40.9 % (ref 34.0–46.6)
Hemoglobin: 13.2 g/dL (ref 11.1–15.9)
MCH: 27.7 pg (ref 26.6–33.0)
MCHC: 32.3 g/dL (ref 31.5–35.7)
MCV: 86 fL (ref 79–97)
Platelets: 342 10*3/uL (ref 150–450)
RBC: 4.76 x10E6/uL (ref 3.77–5.28)
RDW: 13.9 % (ref 11.7–15.4)
WBC: 6.9 10*3/uL (ref 3.4–10.8)

## 2024-06-23 ENCOUNTER — Ambulatory Visit: Payer: Self-pay | Admitting: Family Medicine

## 2024-06-23 LAB — TSH: TSH: 2.46 u[IU]/mL (ref 0.450–4.500)

## 2024-06-23 LAB — HEMOGLOBIN A1C
Est. average glucose Bld gHb Est-mCnc: 117 mg/dL
Hgb A1c MFr Bld: 5.7 % — ABNORMAL HIGH (ref 4.8–5.6)

## 2024-06-23 LAB — SPECIMEN STATUS REPORT

## 2024-06-23 LAB — VITAMIN D 25 HYDROXY (VIT D DEFICIENCY, FRACTURES)

## 2024-06-25 ENCOUNTER — Other Ambulatory Visit: Payer: Self-pay | Admitting: Family Medicine

## 2024-06-25 DIAGNOSIS — Z1231 Encounter for screening mammogram for malignant neoplasm of breast: Secondary | ICD-10-CM

## 2024-07-08 ENCOUNTER — Ambulatory Visit
Admission: RE | Admit: 2024-07-08 | Discharge: 2024-07-08 | Disposition: A | Discharge status: Home / Self Care | Source: Ambulatory Visit | Attending: Family Medicine | Admitting: Family Medicine

## 2024-07-08 DIAGNOSIS — Z1231 Encounter for screening mammogram for malignant neoplasm of breast: Secondary | ICD-10-CM

## 2024-07-17 ENCOUNTER — Ambulatory Visit: Admitting: Family Medicine

## 2024-08-21 ENCOUNTER — Ambulatory Visit: Admitting: Family Medicine

## 2024-08-21 ENCOUNTER — Encounter: Payer: Self-pay | Admitting: Family Medicine

## 2024-08-21 VITALS — BP 134/91 | HR 74 | Ht 64.0 in | Wt 250.4 lb

## 2024-08-21 DIAGNOSIS — I10 Essential (primary) hypertension: Secondary | ICD-10-CM | POA: Diagnosis not present

## 2024-08-21 DIAGNOSIS — R519 Headache, unspecified: Secondary | ICD-10-CM

## 2024-08-21 MED ORDER — RIZATRIPTAN BENZOATE 10 MG PO TABS: 10.0000 mg | ORAL_TABLET | ORAL | 0 refills | Status: AC | PRN

## 2024-08-21 MED ORDER — METOPROLOL SUCCINATE ER 50 MG PO TB24: 50.0000 mg | ORAL_TABLET | Freq: Every day | ORAL | 0 refills | Status: AC

## 2024-08-21 NOTE — Progress Notes (Signed)
 Established Patient Office Visit  Subjective    Patient ID: Deanna Munoz, female    DOB: September 24, 1966  Age: 58 y.o. MRN: [id number]  CC:  Chief Complaint  Patient presents with   Medical Management of Chronic Issues    HPI Deanna Munoz presents for follow up of hypertension. Patient also reports that she has had recurrent headaches. She had a past history of migraine headaches.   Outpatient Encounter Medications as of 08/21/2024  Medication Sig   albuterol  (VENTOLIN  HFA) 108 (90 Base) MCG/ACT inhaler Inhale 2 puffs into the lungs every 6 (six) hours as needed for wheezing or shortness of breath.   cetirizine  (ZYRTEC ) 10 MG tablet Take 1 tablet (10 mg total) by mouth daily.   chlorthalidone  (HYGROTON ) 50 MG tablet Take 1 tablet (50 mg total) by mouth daily.   metoprolol  succinate (TOPROL -XL) 25 MG 24 hr tablet Take 1 tablet (25 mg total) by mouth daily.   metoprolol  succinate (TOPROL -XL) 50 MG 24 hr tablet Take 1 tablet (50 mg total) by mouth daily. Take with or immediately following a meal.   mirtazapine  (REMERON ) 45 MG tablet Take 1 tablet (45 mg total) by mouth at bedtime.   montelukast  (SINGULAIR ) 10 MG tablet Take 1 tablet (10 mg total) by mouth at bedtime.   phentermine  37.5 MG capsule Take 1 capsule (37.5 mg total) by mouth every morning.   rizatriptan  (MAXALT ) 10 MG tablet Take 1 tablet (10 mg total) by mouth as needed for migraine. May repeat in 2 hours if needed   HYDROcodone -acetaminophen  (NORCO/VICODIN) 5-325 MG tablet Take one or two tabs by mouth as needed up to a max of 6 tablets a day for acute knee pain (Patient not taking: Reported on 08/21/2024)   methocarbamol  (ROBAXIN ) 500 MG tablet Take 1 tablet (500 mg total) by mouth 2 (two) times daily. (Patient not taking: Reported on 08/21/2024)   mirtazapine  (REMERON ) 30 MG tablet Take 1 tablet (30 mg total) by mouth at bedtime.   No facility-administered encounter medications on file as of 08/21/2024.    Past Medical  History:  Diagnosis Date   Hypertension    Obesity     History reviewed. No pertinent surgical history.  Family History  Problem Relation Age of Onset   Breast cancer Mother     Social History   Socioeconomic History   Marital status: Married    Spouse name: Not on file   Number of children: Not on file   Years of education: Not on file   Highest education level: Not on file  Occupational History   Not on file  Tobacco Use   Smoking status: Never   Smokeless tobacco: Never  Vaping Use   Vaping status: Never Used  Substance and Sexual Activity   Alcohol use: No   Drug use: No   Sexual activity: Not on file  Other Topics Concern   Not on file  Social History Narrative   Not on file   Social Drivers of Health   Financial Resource Strain: Low Risk  (03/30/2023)   Overall Financial Resource Strain (CARDIA)    Difficulty of Paying Living Expenses: Not very hard  Food Insecurity: No Food Insecurity (03/30/2023)   Hunger Vital Sign    Worried About Running Out of Food in the Last Year: Never true    Ran Out of Food in the Last Year: Never true  Transportation Needs: No Transportation Needs (03/30/2023)   PRAPARE - Transportation    Lack of Transportation (  Medical): No    Lack of Transportation (Non-Medical): No  Physical Activity: Insufficiently Active (03/30/2023)   Exercise Vital Sign    Days of Exercise per Week: 3 days    Minutes of Exercise per Session: 30 min  Stress: No Stress Concern Present (03/30/2023)   [name] of Occupational Health - Occupational Stress Questionnaire    Feeling of Stress : Not at all  Social Connections: Socially Integrated (03/30/2023)   Social Connection and Isolation Panel    Frequency of Communication with Friends and Family: More than three times a week    Frequency of Social Gatherings with Friends and Family: More than three times a week    Attends Religious Services: More than 4 times per year    Active Member of [employer] or  Organizations: Patient unable to answer    Attends Banker Meetings: 1 to 4 times per year    Marital Status: Married  Catering manager Violence: Not At Risk (03/30/2023)   Humiliation, Afraid, Rape, and Kick questionnaire    Fear of Current or Ex-Partner: No    Emotionally Abused: No    Physically Abused: No    Sexually Abused: No    Review of Systems  All other systems reviewed and are negative.       Objective    BP (!) 134/91   Pulse 74   Ht 5' 4 (1.626 m)   Wt 250 lb 6.4 oz (113.6 kg)   SpO2 96%   BMI 42.98 kg/m   Physical Exam Vitals and nursing note reviewed.  Constitutional:      General: She is not in acute distress.    Appearance: She is obese.  Cardiovascular:     Rate and Rhythm: Normal rate and regular rhythm.  Pulmonary:     Effort: Pulmonary effort is normal.     Breath sounds: Normal breath sounds.  Abdominal:     Palpations: Abdomen is soft.     Tenderness: There is no abdominal tenderness.  Neurological:     General: No focal deficit present.     Mental Status: She is alert and oriented to person, place, and time.         Assessment & Plan:   1. Essential hypertension (Primary) Slightly elevated reading. Will increase metoprolol  from 25 to 50 mg daily.   2. Nonintractable headache, unspecified chronicity pattern, unspecified headache type Increased metoprolol  as above. Maxalt  prescribed.     Return in about 2 weeks (around 09/04/2024) for follow up, chronic med issues.   Tanda Raguel SQUIBB, MD

## 2024-08-25 ENCOUNTER — Other Ambulatory Visit: Payer: Self-pay

## 2024-09-08 ENCOUNTER — Ambulatory Visit: Admitting: Family Medicine

## 2024-09-09 ENCOUNTER — Other Ambulatory Visit: Payer: Self-pay | Admitting: Family Medicine

## 2024-10-24 ENCOUNTER — Encounter: Payer: Self-pay | Admitting: Family Medicine

## 2024-10-24 ENCOUNTER — Ambulatory Visit: Admitting: Family Medicine

## 2024-10-24 VITALS — BP 141/86 | HR 75 | Ht 64.0 in | Wt 266.0 lb

## 2024-10-24 DIAGNOSIS — I10 Essential (primary) hypertension: Secondary | ICD-10-CM | POA: Diagnosis not present

## 2024-10-24 DIAGNOSIS — Z6841 Body Mass Index (BMI) 40.0 and over, adult: Secondary | ICD-10-CM

## 2024-10-24 DIAGNOSIS — E66813 Obesity, class 3: Secondary | ICD-10-CM

## 2024-10-24 DIAGNOSIS — F341 Dysthymic disorder: Secondary | ICD-10-CM | POA: Diagnosis not present

## 2024-10-24 DIAGNOSIS — J4520 Mild intermittent asthma, uncomplicated: Secondary | ICD-10-CM | POA: Diagnosis not present

## 2024-10-24 MED ORDER — CHLORTHALIDONE 50 MG PO TABS
50.0000 mg | ORAL_TABLET | Freq: Every day | ORAL | 1 refills | Status: AC
Start: 2024-10-24 — End: ?

## 2024-10-24 MED ORDER — MIRTAZAPINE 45 MG PO TABS: 45.0000 mg | ORAL_TABLET | Freq: Every day | ORAL | 0 refills | Status: DC

## 2024-10-24 MED ORDER — ALBUTEROL SULFATE HFA 108 (90 BASE) MCG/ACT IN AERS: 2.0000 | INHALATION_SPRAY | Freq: Four times a day (QID) | RESPIRATORY_TRACT | 2 refills | Status: AC | PRN

## 2024-10-24 MED ORDER — LOSARTAN POTASSIUM 25 MG PO TABS: 25.0000 mg | ORAL_TABLET | Freq: Every day | ORAL | 0 refills | Status: AC

## 2024-10-28 ENCOUNTER — Encounter: Payer: Self-pay | Admitting: Family Medicine

## 2024-10-28 NOTE — Progress Notes (Signed)
 Established Patient Office Visit  Subjective    Patient ID: Deanna Munoz, female    DOB: 1966/05/29  Age: 58 y.o. MRN: [id number]  CC:  Chief Complaint  Patient presents with   Medical Management of Chronic Issues    HPI Deanna Munoz presents for routine follow up of chronic med issues including hypertension, depression, and asthma. Patient reports moderate med compliance and denies acute complaints.   Outpatient Encounter Medications as of 10/24/2024  Medication Sig   cetirizine  (ZYRTEC ) 10 MG tablet Take 1 tablet (10 mg total) by mouth daily.   losartan (COZAAR) 25 MG tablet Take 1 tablet (25 mg total) by mouth daily.   metoprolol  succinate (TOPROL -XL) 50 MG 24 hr tablet Take 1 tablet (50 mg total) by mouth daily. Take with or immediately following a meal.   montelukast  (SINGULAIR ) 10 MG tablet TAKE 1 TABLET (10 MG TOTAL) BY MOUTH AT BEDTIME.   albuterol  (VENTOLIN  HFA) 108 (90 Base) MCG/ACT inhaler Inhale 2 puffs into the lungs every 6 (six) hours as needed for wheezing or shortness of breath.   chlorthalidone  (HYGROTON ) 50 MG tablet Take 1 tablet (50 mg total) by mouth daily.   mirtazapine  (REMERON ) 30 MG tablet Take 1 tablet (30 mg total) by mouth at bedtime.   mirtazapine  (REMERON ) 45 MG tablet Take 1 tablet (45 mg total) by mouth at bedtime.   phentermine  37.5 MG capsule Take 1 capsule (37.5 mg total) by mouth every morning. (Patient not taking: Reported on 10/24/2024)   rizatriptan  (MAXALT ) 10 MG tablet Take 1 tablet (10 mg total) by mouth as needed for migraine. May repeat in 2 hours if needed   [DISCONTINUED] albuterol  (VENTOLIN  HFA) 108 (90 Base) MCG/ACT inhaler Inhale 2 puffs into the lungs every 6 (six) hours as needed for wheezing or shortness of breath.   [DISCONTINUED] chlorthalidone  (HYGROTON ) 50 MG tablet Take 1 tablet (50 mg total) by mouth daily.   [DISCONTINUED] metoprolol  succinate (TOPROL -XL) 25 MG 24 hr tablet Take 1 tablet (25 mg total) by mouth daily.    [DISCONTINUED] mirtazapine  (REMERON ) 45 MG tablet Take 1 tablet (45 mg total) by mouth at bedtime.   No facility-administered encounter medications on file as of 10/24/2024.    Past Medical History:  Diagnosis Date   Hypertension    Obesity     History reviewed. No pertinent surgical history.  Family History  Problem Relation Age of Onset   Breast cancer Mother     Social History   Socioeconomic History   Marital status: Married    Spouse name: Not on file   Number of children: Not on file   Years of education: Not on file   Highest education level: Not on file  Occupational History   Not on file  Tobacco Use   Smoking status: Never   Smokeless tobacco: Never  Vaping Use   Vaping status: Never Used  Substance and Sexual Activity   Alcohol use: No   Drug use: No   Sexual activity: Not on file  Other Topics Concern   Not on file  Social History Narrative   Not on file   Social Drivers of Health   Financial Resource Strain: Low Risk  (10/24/2024)   Overall Financial Resource Strain (CARDIA)    Difficulty of Paying Living Expenses: Not very hard  Food Insecurity: No Food Insecurity (10/24/2024)   Hunger Vital Sign    Worried About Running Out of Food in the Last Year: Never true    Ran  Out of Food in the Last Year: Never true  Transportation Needs: No Transportation Needs (10/24/2024)   PRAPARE - Administrator, Civil Service (Medical): No    Lack of Transportation (Non-Medical): No  Physical Activity: Insufficiently Active (03/30/2023)   Exercise Vital Sign    Days of Exercise per Week: 3 days    Minutes of Exercise per Session: 30 min  Stress: No Stress Concern Present (10/24/2024)   [name] of Occupational Health - Occupational Stress Questionnaire    Feeling of Stress: Not at all  Social Connections: Socially Integrated (03/30/2023)   Social Connection and Isolation Panel    Frequency of Communication with Friends and Family: More than  three times a week    Frequency of Social Gatherings with Friends and Family: More than three times a week    Attends Religious Services: More than 4 times per year    Active Member of [employer] or Organizations: Patient unable to answer    Attends Banker Meetings: 1 to 4 times per year    Marital Status: Married  Catering Manager Violence: Not At Risk (10/24/2024)   Humiliation, Afraid, Rape, and Kick questionnaire    Fear of Current or Ex-Partner: No    Emotionally Abused: No    Physically Abused: No    Sexually Abused: No    Review of Systems  All other systems reviewed and are negative.       Objective    BP (!) 141/86   Pulse 75   Ht 5' 4 (1.626 m)   Wt 266 lb (120.7 kg)   SpO2 96%   BMI 45.66 kg/m   Physical Exam Vitals and nursing note reviewed.  Constitutional:      General: She is not in acute distress.    Appearance: She is obese.  Cardiovascular:     Rate and Rhythm: Normal rate and regular rhythm.  Pulmonary:     Effort: Pulmonary effort is normal.     Breath sounds: Normal breath sounds.  Abdominal:     Palpations: Abdomen is soft.     Tenderness: There is no abdominal tenderness.  Neurological:     General: No focal deficit present.     Mental Status: She is alert and oriented to person, place, and time.  Psychiatric:        Mood and Affect: Mood normal.        Behavior: Behavior normal.         Assessment & Plan:   1. Essential hypertension (Primary) Slightly elevated readings. Discussed compliance. Meds refilled   2. Dysthymia Discussed compliance. Remeron  refilled.   3. Mild intermittent asthma without complication Albuterol  MDI refilled.   4. Class 3 severe obesity due to excess calories with serious comorbidity and body mass index (BMI) of 45.0 to 49.9 in adult [facility])   Return in about 4 weeks (around 11/21/2024) for follow up.   Tanda Raguel SQUIBB, MD

## 2024-11-17 ENCOUNTER — Encounter: Payer: Self-pay | Admitting: Family Medicine

## 2024-11-17 ENCOUNTER — Ambulatory Visit: Payer: Self-pay | Admitting: Family Medicine

## 2024-11-17 VITALS — BP 104/70 | HR 69 | Ht 64.0 in | Wt 268.2 lb

## 2024-11-17 DIAGNOSIS — F341 Dysthymic disorder: Secondary | ICD-10-CM

## 2024-11-17 DIAGNOSIS — Z6841 Body Mass Index (BMI) 40.0 and over, adult: Secondary | ICD-10-CM

## 2024-11-17 DIAGNOSIS — I10 Essential (primary) hypertension: Secondary | ICD-10-CM

## 2024-11-17 DIAGNOSIS — E66813 Obesity, class 3: Secondary | ICD-10-CM

## 2024-11-17 DIAGNOSIS — J4520 Mild intermittent asthma, uncomplicated: Secondary | ICD-10-CM

## 2024-11-17 MED ORDER — MONTELUKAST SODIUM 10 MG PO TABS: 10.0000 mg | ORAL_TABLET | Freq: Every day | ORAL | 0 refills | Status: DC

## 2024-11-17 MED ORDER — PULMICORT FLEXHALER 90 MCG/ACT IN AEPB: 1.0000 | INHALATION_SPRAY | Freq: Two times a day (BID) | RESPIRATORY_TRACT | 6 refills | Status: AC

## 2024-11-17 MED ORDER — MIRTAZAPINE 45 MG PO TABS: 45.0000 mg | ORAL_TABLET | Freq: Every day | ORAL | 0 refills | Status: AC

## 2024-11-17 NOTE — Progress Notes (Signed)
 Established Patient Office Visit  Subjective    Patient ID: Deanna Munoz, female    DOB: 06/27/1966  Age: 58 y.o. MRN: [id number]  CC:  Chief Complaint  Patient presents with   Medical Management of Chronic Issues    HPI Deanna Munoz presents for routine follow up of chronic med issues including hypertension. Patient reports med compliance. Patient also reports that she has been having wheezing that causes her to utilize inhaler on a daily basis.   Outpatient Encounter Medications as of 11/17/2024  Medication Sig   albuterol  (VENTOLIN  HFA) 108 (90 Base) MCG/ACT inhaler Inhale 2 puffs into the lungs every 6 (six) hours as needed for wheezing or shortness of breath.   Budesonide (PULMICORT FLEXHALER) 90 MCG/ACT inhaler Inhale 1 puff into the lungs 2 (two) times daily.   cetirizine  (ZYRTEC ) 10 MG tablet Take 1 tablet (10 mg total) by mouth daily.   chlorthalidone  (HYGROTON ) 50 MG tablet Take 1 tablet (50 mg total) by mouth daily.   losartan  (COZAAR ) 25 MG tablet Take 1 tablet (25 mg total) by mouth daily.   metoprolol  succinate (TOPROL -XL) 50 MG 24 hr tablet Take 1 tablet (50 mg total) by mouth daily. Take with or immediately following a meal.   rizatriptan  (MAXALT ) 10 MG tablet Take 1 tablet (10 mg total) by mouth as needed for migraine. May repeat in 2 hours if needed   [DISCONTINUED] mirtazapine  (REMERON ) 45 MG tablet Take 1 tablet (45 mg total) by mouth at bedtime.   mirtazapine  (REMERON ) 30 MG tablet Take 1 tablet (30 mg total) by mouth at bedtime. (Patient not taking: Reported on 11/17/2024)   mirtazapine  (REMERON ) 45 MG tablet Take 1 tablet (45 mg total) by mouth at bedtime.   montelukast  (SINGULAIR ) 10 MG tablet Take 1 tablet (10 mg total) by mouth at bedtime.   phentermine  37.5 MG capsule Take 1 capsule (37.5 mg total) by mouth every morning. (Patient not taking: Reported on 11/17/2024)   [DISCONTINUED] montelukast  (SINGULAIR ) 10 MG tablet TAKE 1 TABLET (10 MG TOTAL) BY MOUTH  AT BEDTIME.   No facility-administered encounter medications on file as of 11/17/2024.    Past Medical History:  Diagnosis Date   Hypertension    Obesity     History reviewed. No pertinent surgical history.  Family History  Problem Relation Age of Onset   Breast cancer Mother     Social History   Socioeconomic History   Marital status: Married    Spouse name: Not on file   Number of children: Not on file   Years of education: Not on file   Highest education level: Not on file  Occupational History   Not on file  Tobacco Use   Smoking status: Never   Smokeless tobacco: Never  Vaping Use   Vaping status: Never Used  Substance and Sexual Activity   Alcohol use: No   Drug use: No   Sexual activity: Not on file  Other Topics Concern   Not on file  Social History Narrative   Not on file   Social Drivers of Health   Financial Resource Strain: Low Risk  (10/24/2024)   Overall Financial Resource Strain (CARDIA)    Difficulty of Paying Living Expenses: Not very hard  Food Insecurity: No Food Insecurity (10/24/2024)   Hunger Vital Sign    Worried About Running Out of Food in the Last Year: Never true    Ran Out of Food in the Last Year: Never true  Transportation Needs: No  Transportation Needs (10/24/2024)   PRAPARE - Administrator, Civil Service (Medical): No    Lack of Transportation (Non-Medical): No  Physical Activity: Insufficiently Active (03/30/2023)   Exercise Vital Sign    Days of Exercise per Week: 3 days    Minutes of Exercise per Session: 30 min  Stress: No Stress Concern Present (10/24/2024)   [name] of Occupational Health - Occupational Stress Questionnaire    Feeling of Stress: Not at all  Social Connections: Socially Integrated (03/30/2023)   Social Connection and Isolation Panel    Frequency of Communication with Friends and Family: More than three times a week    Frequency of Social Gatherings with Friends and Family: More than  three times a week    Attends Religious Services: More than 4 times per year    Active Member of [employer] or Organizations: Patient unable to answer    Attends Banker Meetings: 1 to 4 times per year    Marital Status: Married  Catering Manager Violence: Not At Risk (10/24/2024)   Humiliation, Afraid, Rape, and Kick questionnaire    Fear of Current or Ex-Partner: No    Emotionally Abused: No    Physically Abused: No    Sexually Abused: No    Review of Systems  Respiratory:  Positive for wheezing. Negative for shortness of breath.   All other systems reviewed and are negative.       Objective    BP 104/70   Pulse 69   Ht 5' 4 (1.626 m)   Wt 268 lb 3.2 oz (121.7 kg)   SpO2 93%   BMI 46.04 kg/m   Physical Exam Vitals and nursing note reviewed.  Constitutional:      General: She is not in acute distress. Cardiovascular:     Rate and Rhythm: Normal rate and regular rhythm.  Pulmonary:     Effort: Pulmonary effort is normal.     Breath sounds: Normal breath sounds. No wheezing.  Abdominal:     Palpations: Abdomen is soft.     Tenderness: There is no abdominal tenderness.  Neurological:     General: No focal deficit present.     Mental Status: She is alert and oriented to person, place, and time.  Psychiatric:        Mood and Affect: Mood normal.        Behavior: Behavior normal.         Assessment & Plan:   Essential hypertension  Dysthymia  Class 3 severe obesity due to excess calories with serious comorbidity and body mass index (BMI) of 45.0 to 49.9 in adult (HCC)  Mild intermittent asthma without complication  Other orders -     Mirtazapine ; Take 1 tablet (45 mg total) by mouth at bedtime.  Dispense: 90 tablet; Refill: 0 -     Montelukast  Sodium; Take 1 tablet (10 mg total) by mouth at bedtime.  Dispense: 30 tablet; Refill: 0 -     Pulmicort Flexhaler; Inhale 1 puff into the lungs 2 (two) times daily.  Dispense: 1 each; Refill: 6      Return in about 3 months (around 02/15/2025) for follow up.   Tanda Raguel SQUIBB, MD

## 2024-11-18 ENCOUNTER — Other Ambulatory Visit: Payer: Self-pay

## 2024-11-28 NOTE — Progress Notes (Signed)
 Deanna Munoz                                          MRN: [id number]   11/28/2024   The [facility] Quality Team Specialist reviewed this patient medical record for the purposes of chart review for care gap closure. The following were reviewed: abstraction for care gap closure-controlling blood pressure.    [facility] Quality Team

## 2025-01-02 ENCOUNTER — Other Ambulatory Visit: Payer: Self-pay

## 2025-01-06 MED ORDER — MONTELUKAST SODIUM 10 MG PO TABS: 10.0000 mg | ORAL_TABLET | Freq: Every day | ORAL | 0 refills | Status: AC

## 2025-02-24 ENCOUNTER — Ambulatory Visit: Payer: Self-pay | Admitting: Family Medicine
